# Patient Record
Sex: Male | Born: 1980
Health system: Southern US, Community
[De-identification: ages and names within clinical notes are randomized; demographics above are authoritative.]

## PROBLEM LIST (undated history)

## (undated) DIAGNOSIS — M5126 Other intervertebral disc displacement, lumbar region: Secondary | ICD-10-CM

## (undated) DIAGNOSIS — K219 Gastro-esophageal reflux disease without esophagitis: Secondary | ICD-10-CM

## (undated) DIAGNOSIS — G4730 Sleep apnea, unspecified: Secondary | ICD-10-CM

## (undated) HISTORY — DX: Sleep apnea, unspecified: G47.30

## (undated) HISTORY — DX: Other intervertebral disc displacement, lumbar region: M51.26

## (undated) HISTORY — PX: EYE SURGERY: SHX253

---

## 2004-04-17 ENCOUNTER — Emergency Department: Payer: Self-pay | Admitting: Emergency Medicine

## 2006-01-14 ENCOUNTER — Ambulatory Visit: Payer: Self-pay | Admitting: Internal Medicine

## 2006-01-18 ENCOUNTER — Ambulatory Visit: Payer: Self-pay | Admitting: Internal Medicine

## 2007-01-25 ENCOUNTER — Ambulatory Visit: Payer: Self-pay | Admitting: Internal Medicine

## 2008-06-14 ENCOUNTER — Ambulatory Visit: Payer: Self-pay | Admitting: Internal Medicine

## 2011-02-09 ENCOUNTER — Emergency Department: Payer: Self-pay | Admitting: *Deleted

## 2011-06-25 ENCOUNTER — Ambulatory Visit: Payer: Self-pay

## 2012-06-19 ENCOUNTER — Emergency Department: Payer: Self-pay | Admitting: Emergency Medicine

## 2012-06-19 LAB — RAPID INFLUENZA A&B ANTIGENS

## 2012-06-20 LAB — CBC
HCT: 45.8 % (ref 40.0–52.0)
HGB: 15.5 g/dL (ref 13.0–18.0)
MCH: 29.8 pg (ref 26.0–34.0)
MCHC: 33.8 g/dL (ref 32.0–36.0)
MCV: 88 fL (ref 80–100)
Platelet: 171 10*3/uL (ref 150–440)
RBC: 5.19 10*6/uL (ref 4.40–5.90)
RDW: 13.5 % (ref 11.5–14.5)
WBC: 6.8 10*3/uL (ref 3.8–10.6)

## 2012-06-20 LAB — COMPREHENSIVE METABOLIC PANEL
Albumin: 4.1 g/dL (ref 3.4–5.0)
Alkaline Phosphatase: 93 U/L (ref 50–136)
Anion Gap: 9 (ref 7–16)
BUN: 15 mg/dL (ref 7–18)
Bilirubin,Total: 0.5 mg/dL (ref 0.2–1.0)
Calcium, Total: 8.5 mg/dL (ref 8.5–10.1)
Chloride: 104 mmol/L (ref 98–107)
Co2: 25 mmol/L (ref 21–32)
Creatinine: 1.03 mg/dL (ref 0.60–1.30)
EGFR (African American): >60
EGFR (Non-African Amer.): >60
Glucose: 114 mg/dL — ABNORMAL HIGH (ref 65–99)
Osmolality: 277 (ref 275–301)
Potassium: 3.3 mmol/L — ABNORMAL LOW (ref 3.5–5.1)
SGOT(AST): 45 U/L — ABNORMAL HIGH (ref 15–37)
SGPT (ALT): 55 U/L (ref 12–78)
Sodium: 138 mmol/L (ref 136–145)
Total Protein: 7.7 g/dL (ref 6.4–8.2)

## 2012-10-25 ENCOUNTER — Encounter (HOSPITAL_COMMUNITY): Payer: Self-pay | Admitting: Emergency Medicine

## 2012-10-25 ENCOUNTER — Emergency Department (HOSPITAL_COMMUNITY): Payer: BC Managed Care – PPO

## 2012-10-25 ENCOUNTER — Emergency Department (HOSPITAL_COMMUNITY)
Admission: EM | Admit: 2012-10-25 | Discharge: 2012-10-25 | Disposition: A | Discharge status: Home / Self Care | Payer: BC Managed Care – PPO | Attending: Emergency Medicine | Admitting: Emergency Medicine

## 2012-10-25 DIAGNOSIS — R52 Pain, unspecified: Secondary | ICD-10-CM

## 2012-10-25 DIAGNOSIS — Z8719 Personal history of other diseases of the digestive system: Secondary | ICD-10-CM | POA: Insufficient documentation

## 2012-10-25 DIAGNOSIS — F17200 Nicotine dependence, unspecified, uncomplicated: Secondary | ICD-10-CM | POA: Insufficient documentation

## 2012-10-25 DIAGNOSIS — R0789 Other chest pain: Secondary | ICD-10-CM | POA: Insufficient documentation

## 2012-10-25 DIAGNOSIS — R7309 Other abnormal glucose: Secondary | ICD-10-CM | POA: Insufficient documentation

## 2012-10-25 HISTORY — DX: Gastro-esophageal reflux disease without esophagitis: K21.9

## 2012-10-25 LAB — POCT I-STAT, CHEM 8
BUN: 13 mg/dL (ref 6–23)
Calcium, Ion: 1.2 mmol/L (ref 1.12–1.23)
Chloride: 104 mEq/L (ref 96–112)
Creatinine, Ser: 0.9 mg/dL (ref 0.50–1.35)
Glucose, Bld: 81 mg/dL (ref 70–99)
HCT: 46 % (ref 39.0–52.0)
Hemoglobin: 15.6 g/dL (ref 13.0–17.0)
Potassium: 4 mEq/L (ref 3.5–5.1)
Sodium: 141 mEq/L (ref 135–145)
TCO2: 28 mmol/L (ref 0–100)

## 2012-10-25 LAB — COMPREHENSIVE METABOLIC PANEL
ALT: 43 U/L (ref 0–53)
AST: 40 U/L — ABNORMAL HIGH (ref 0–37)
Albumin: 4.2 g/dL (ref 3.5–5.2)
Alkaline Phosphatase: 91 U/L (ref 39–117)
BUN: 14 mg/dL (ref 6–23)
CO2: 24 mEq/L (ref 19–32)
Calcium: 9.7 mg/dL (ref 8.4–10.5)
Chloride: 101 mEq/L (ref 96–112)
Creatinine, Ser: 0.82 mg/dL (ref 0.50–1.35)
GFR calc Af Amer: >90 mL/min (ref >90)
GFR calc non Af Amer: >90 mL/min (ref >90)
Glucose, Bld: 136 mg/dL — ABNORMAL HIGH (ref 70–99)
Potassium: 3.7 mEq/L (ref 3.5–5.1)
Sodium: 136 mEq/L (ref 135–145)
Total Bilirubin: 0.3 mg/dL (ref 0.3–1.2)
Total Protein: 7.8 g/dL (ref 6.0–8.3)

## 2012-10-25 LAB — CBC
HCT: 44.6 % (ref 39.0–52.0)
Hemoglobin: 15.7 g/dL (ref 13.0–17.0)
MCH: 30.4 pg (ref 26.0–34.0)
MCHC: 35.2 g/dL (ref 30.0–36.0)
MCV: 86.3 fL (ref 78.0–100.0)
Platelets: 200 10*3/uL (ref 150–400)
RBC: 5.17 MIL/uL (ref 4.22–5.81)
RDW: 12.8 % (ref 11.5–15.5)
WBC: 7.2 10*3/uL (ref 4.0–10.5)

## 2012-10-25 LAB — POCT I-STAT TROPONIN I: Troponin i, poc: 0 ng/mL (ref 0.00–0.08)

## 2012-10-25 LAB — D-DIMER, QUANTITATIVE: D-Dimer, Quant: <0.27 ug/mL-FEU (ref 0.00–0.48)

## 2012-10-25 MED ORDER — HYDROCODONE-ACETAMINOPHEN 5-325 MG PO TABS: 2.0000 | ORAL_TABLET | Freq: Four times a day (QID) | ORAL | Status: DC | PRN

## 2012-10-25 MED ORDER — HYDROCODONE-ACETAMINOPHEN 5-325 MG PO TABS
2.0000 | ORAL_TABLET | Freq: Once | ORAL | Status: AC
  Administered 2012-10-25: 2 via ORAL
  Filled 2012-10-25: qty 2

## 2012-10-25 NOTE — ED Notes (Signed)
crampy pain in chest mostly left side hurts to take a deep breath no n/v started 30 mins ago after he ate

## 2012-10-25 NOTE — ED Provider Notes (Signed)
History     CSN: [id number]  Arrival date & time 10/25/12  1255   First MD Initiated Contact with Patient 10/25/12 1329      Chief Complaint  Patient presents with  . Chest Pain    (Consider location/radiation/quality/duration/timing/severity/associated sxs/prior treatment) Patient is a 32 y.o. male presenting with chest pain.  Chest Pain  comPlains of chest pain sudden onset left-sided pleuritic 11:45 AM today while he was doing Calking. Pain is pleuritic worse with changing positions not improved by anything moderate at present. No cough no fever no other associated symptoms. Past Medical History  Diagnosis Date  . Acid reflux    Cardiac risk factors smoker, family history No past surgical history on file.  No family history on file. family history father had MI age 66 History  Substance Use Topics  . Smoking status: Light Tobacco Smoker  . Smokeless tobacco: Not on file  . Alcohol Use: Yes      Review of Systems  Constitutional: Negative.   HENT: Negative.   Respiratory: Negative.   Cardiovascular: Positive for chest pain.  Gastrointestinal: Negative.   Musculoskeletal: Negative.   Skin: Negative.   Neurological: Negative.   Psychiatric/Behavioral: Negative.   All other systems reviewed and are negative.    Allergies  Review of patient's allergies indicates no known allergies.  Home Medications  No current outpatient prescriptions on file.  BP 136/78  Pulse 76  Temp(Src) 97.4 F (36.3 C) (Oral)  Resp 24  SpO2 100%  Physical Exam  Nursing note and vitals reviewed. Constitutional: He appears well-developed and well-nourished.  HENT:  Head: Normocephalic and atraumatic.  Eyes: Conjunctivae are normal. Pupils are equal, round, and reactive to light.  Neck: Neck supple. No tracheal deviation present. No thyromegaly present.  Cardiovascular: Normal rate and regular rhythm.   No murmur heard. Pulmonary/Chest: Effort normal and breath sounds normal.  He exhibits tenderness.  Left chest wall is tender. Pain is reproducible by forcible abduction of left shoulder  Abdominal: Soft. Bowel sounds are normal. He exhibits no distension. There is no tenderness.  Musculoskeletal: Normal range of motion. He exhibits no edema and no tenderness.  Neurological: He is alert. Coordination normal.  Skin: Skin is warm and dry. No rash noted.  Psychiatric: He has a normal mood and affect.    ED Course  Procedures (including critical care time)  Labs Reviewed  COMPREHENSIVE METABOLIC PANEL - Abnormal; Notable for the following:    Glucose, Bld 136 (*)    AST 40 (*)    All other components within normal limits  CBC  D-DIMER, QUANTITATIVE  POCT I-STAT TROPONIN I   Dg Chest 2 View  10/25/2012  *RADIOLOGY REPORT*  Clinical Data: Chest pain  CHEST - 2 VIEW  Comparison: None.  Findings: Cardiomediastinal silhouette is unremarkable.  Mild elevation of the right hemidiaphragm.  No acute infiltrate or pleural effusion.  No pulmonary edema.  Bony thorax is unremarkable.  IMPRESSION: No active disease.  Mild elevation of the right hemidiaphragm.   Original Report Authenticated By: Natasha Mead, M.D.    chest xray viewed byme  Date: 10/25/2012  Rate: 70  Rhythm: normal sinus rhythm  QRS Axis: normal  Intervals: normal  ST/T Wave abnormalities: normal  Conduction Disutrbances:nonspecific intraventricular conduction delay  Narrative Interpretation:   Old EKG Reviewed: none available Results for orders placed during the hospital encounter of 10/25/12  CBC      Result Value Range   WBC 7.2  4.0 - 10.5 K/uL  RBC 5.17  4.22 - 5.81 MIL/uL   Hemoglobin 15.7  13.0 - 17.0 g/dL   HCT 04.5  40.9 - 81.1 %   MCV 86.3  78.0 - 100.0 fL   MCH 30.4  26.0 - 34.0 pg   MCHC 35.2  30.0 - 36.0 g/dL   RDW 91.4  78.2 - 95.6 %   Platelets 200  150 - 400 K/uL  COMPREHENSIVE METABOLIC PANEL      Result Value Range   Sodium 136  135 - 145 mEq/L   Potassium 3.7  3.5 - 5.1 mEq/L    Chloride 101  96 - 112 mEq/L   CO2 24  19 - 32 mEq/L   Glucose, Bld 136 (*) 70 - 99 mg/dL   BUN 14  6 - 23 mg/dL   Creatinine, Ser 2.13  0.50 - 1.35 mg/dL   Calcium 9.7  8.4 - 08.6 mg/dL   Total Protein 7.8  6.0 - 8.3 g/dL   Albumin 4.2  3.5 - 5.2 g/dL   AST 40 (*) 0 - 37 U/L   ALT 43  0 - 53 U/L   Alkaline Phosphatase 91  39 - 117 U/L   Total Bilirubin 0.3  0.3 - 1.2 mg/dL   GFR calc non Af Amer >90  >90 mL/min   GFR calc Af Amer >90  >90 mL/min  D-DIMER, QUANTITATIVE      Result Value Range   D-Dimer, Quant <0.27  0.00 - 0.48 ug/mL-FEU  POCT I-STAT TROPONIN I      Result Value Range   Troponin i, poc 0.00  0.00 - 0.08 ng/mL   Comment 3           POCT I-STAT, CHEM 8      Result Value Range   Sodium 141  135 - 145 mEq/L   Potassium 4.0  3.5 - 5.1 mEq/L   Chloride 104  96 - 112 mEq/L   BUN 13  6 - 23 mg/dL   Creatinine, Ser 5.78  0.50 - 1.35 mg/dL   Glucose, Bld 81  70 - 99 mg/dL   Calcium, Ion 4.69  1.12 - 1.23 mmol/L   TCO2 28  0 - 100 mmol/L   Hemoglobin 15.6  13.0 - 17.0 g/dL   HCT 62.9  52.8 - 41.3 %   Dg Chest 2 View  10/25/2012  *RADIOLOGY REPORT*  Clinical Data: Chest pain  CHEST - 2 VIEW  Comparison: None.  Findings: Cardiomediastinal silhouette is unremarkable.  Mild elevation of the right hemidiaphragm.  No acute infiltrate or pleural effusion.  No pulmonary edema.  Bony thorax is unremarkable.  IMPRESSION: No active disease.  Mild elevation of the right hemidiaphragm.   Original Report Authenticated By: Natasha Mead, M.D.       No diagnosis found.  4:30 PM pain improved after treatment with Norco. MDM  Symptoms and exam consistent with musculoskeletal chest pain,. Strong doubt acute coronary syndrome in this young male. Doubt pe , low pre-test prob neg ddimer.  Counccilled for 5 minutes on smoking cessation Plan prescription Norco Patient is instructed to followup with his primary care physician regarding hyperglycemia and smoking cessation Diagnosis #1  atypical chest pain #2 hyperglycemia #3 tobacco abuse        Doug Sou, MD 10/25/12 1639

## 2013-01-09 ENCOUNTER — Ambulatory Visit: Payer: Self-pay

## 2014-05-12 ENCOUNTER — Emergency Department: Payer: Self-pay | Admitting: Student

## 2014-05-15 ENCOUNTER — Ambulatory Visit: Payer: Self-pay

## 2014-05-16 ENCOUNTER — Emergency Department: Payer: Self-pay | Admitting: Emergency Medicine

## 2014-05-16 LAB — D-DIMER(ARMC): D-Dimer: 442 ng/ml

## 2014-05-16 LAB — CBC WITH DIFFERENTIAL/PLATELET
Basophil #: 0.1 10*3/uL (ref 0.0–0.1)
Basophil %: 0.6 %
Eosinophil #: 0.1 10*3/uL (ref 0.0–0.7)
Eosinophil %: 1.5 %
HCT: 48.3 % (ref 40.0–52.0)
HGB: 16 g/dL (ref 13.0–18.0)
Lymphocyte #: 3.1 10*3/uL (ref 1.0–3.6)
Lymphocyte %: 37.6 %
MCH: 29.9 pg (ref 26.0–34.0)
MCHC: 33.3 g/dL (ref 32.0–36.0)
MCV: 90 fL (ref 80–100)
Monocyte #: 0.6 x10 3/mm (ref 0.2–1.0)
Monocyte %: 7.2 %
Neutrophil #: 4.4 10*3/uL (ref 1.4–6.5)
Neutrophil %: 53.1 %
Platelet: 228 10*3/uL (ref 150–440)
RBC: 5.37 10*6/uL (ref 4.40–5.90)
RDW: 12.9 % (ref 11.5–14.5)
WBC: 8.2 10*3/uL (ref 3.8–10.6)

## 2014-05-16 LAB — COMPREHENSIVE METABOLIC PANEL
Albumin: 3.9 g/dL (ref 3.4–5.0)
Alkaline Phosphatase: 80 U/L
Anion Gap: 5 — ABNORMAL LOW (ref 7–16)
BUN: 16 mg/dL (ref 7–18)
Bilirubin,Total: 0.3 mg/dL (ref 0.2–1.0)
Calcium, Total: 8.8 mg/dL (ref 8.5–10.1)
Chloride: 106 mmol/L (ref 98–107)
Co2: 29 mmol/L (ref 21–32)
Creatinine: 0.9 mg/dL (ref 0.60–1.30)
EGFR (African American): >60
EGFR (Non-African Amer.): >60
Glucose: 84 mg/dL (ref 65–99)
Osmolality: 280 (ref 275–301)
Potassium: 3.8 mmol/L (ref 3.5–5.1)
SGOT(AST): 22 U/L (ref 15–37)
SGPT (ALT): 34 U/L
Sodium: 140 mmol/L (ref 136–145)
Total Protein: 7.3 g/dL (ref 6.4–8.2)

## 2014-05-31 ENCOUNTER — Ambulatory Visit: Payer: Self-pay | Admitting: Internal Medicine

## 2014-08-05 ENCOUNTER — Emergency Department: Payer: Self-pay | Admitting: Internal Medicine

## 2014-08-05 LAB — COMPREHENSIVE METABOLIC PANEL
Albumin: 4.2 g/dL (ref 3.4–5.0)
Alkaline Phosphatase: 88 U/L (ref 46–116)
Anion Gap: 7 (ref 7–16)
BUN: 13 mg/dL (ref 7–18)
Bilirubin,Total: 0.3 mg/dL (ref 0.2–1.0)
Calcium, Total: 8.8 mg/dL (ref 8.5–10.1)
Chloride: 110 mmol/L — ABNORMAL HIGH (ref 98–107)
Co2: 25 mmol/L (ref 21–32)
Creatinine: 0.93 mg/dL (ref 0.60–1.30)
EGFR (African American): >60
EGFR (Non-African Amer.): >60
Glucose: 109 mg/dL — ABNORMAL HIGH (ref 65–99)
Osmolality: 284 (ref 275–301)
Potassium: 3.6 mmol/L (ref 3.5–5.1)
SGOT(AST): 38 U/L — ABNORMAL HIGH (ref 15–37)
SGPT (ALT): 53 U/L (ref 14–63)
Sodium: 142 mmol/L (ref 136–145)
Total Protein: 7.9 g/dL (ref 6.4–8.2)

## 2014-08-05 LAB — CBC
HCT: 47.2 % (ref 40.0–52.0)
HGB: 15.3 g/dL (ref 13.0–18.0)
MCH: 29.4 pg (ref 26.0–34.0)
MCHC: 32.5 g/dL (ref 32.0–36.0)
MCV: 91 fL (ref 80–100)
Platelet: 227 10*3/uL (ref 150–440)
RBC: 5.21 10*6/uL (ref 4.40–5.90)
RDW: 13.8 % (ref 11.5–14.5)
WBC: 9.8 10*3/uL (ref 3.8–10.6)

## 2014-08-05 LAB — DRUG SCREEN, URINE
Amphetamines, Ur Screen: NEGATIVE (ref ?–1000)
Barbiturates, Ur Screen: NEGATIVE (ref ?–200)
Benzodiazepine, Ur Scrn: NEGATIVE (ref ?–200)
Cannabinoid 50 Ng, Ur ~~LOC~~: POSITIVE (ref ?–50)
Cocaine Metabolite,Ur ~~LOC~~: NEGATIVE (ref ?–300)
MDMA (Ecstasy)Ur Screen: NEGATIVE (ref ?–500)
Methadone, Ur Screen: NEGATIVE (ref ?–300)
Opiate, Ur Screen: NEGATIVE (ref ?–300)
Phencyclidine (PCP) Ur S: NEGATIVE (ref ?–25)
Tricyclic, Ur Screen: NEGATIVE (ref ?–1000)

## 2014-08-05 LAB — URINALYSIS, COMPLETE
Bacteria: NONE SEEN
Bilirubin,UR: NEGATIVE
Blood: NEGATIVE
Glucose,UR: NEGATIVE mg/dL (ref 0–75)
Ketone: NEGATIVE
Leukocyte Esterase: NEGATIVE
Nitrite: NEGATIVE
Ph: 5 (ref 4.5–8.0)
Protein: NEGATIVE
RBC,UR: NONE SEEN /HPF (ref 0–5)
Specific Gravity: 1.025 (ref 1.003–1.030)
Squamous Epithelial: NONE SEEN
WBC UR: NONE SEEN /HPF (ref 0–5)

## 2014-08-05 LAB — ETHANOL: Ethanol: <3 mg/dL

## 2014-08-05 LAB — SALICYLATE LEVEL: Salicylates, Serum: <1.7 mg/dL

## 2014-08-05 LAB — ACETAMINOPHEN LEVEL: Acetaminophen: <2 ug/mL

## 2014-10-28 NOTE — Consult Note (Signed)
PATIENT NAME:  John Randall, John Randall MR#:  [id number] DATE OF BIRTH:  [birth year]  DATE OF CONSULTATION:  08/06/2014  CONSULTING PHYSICIAN:  Nolberto Cheuvront S. Garnetta Buddy, MD  REASON FOR CONSULTATION: "I had a fight with my wife and I told her that I was going to throw myself in the river."   HISTORY OF PRESENT ILLNESS:  The patient is a 34 year old married male presented to the ER on IVC, as he was engaged in an argument with his wife. The patient reported that he currently lives with his wife and they have five children together. The patient's wife has three children and he currently has two daughters, 64 and 50 years old. The patient reported that his wife usually has arguments with him and he cannot tolerate the arguments any longer. The patient reported that he does not have any thoughts to hurt himself.  He was worried about how this would affect his daughters, ages 37 and 77 years old. He reported that his wife keeps on yelling and arguing with him all the time.  He reported that she would stop about one thing and then started yelling  The patient reported that when she was arguing yesterday, he just tested her that he is going to throw himself in the river. Then he became depressed and left the house. He went to the police department, so they can talk over there in front of the police. The wife came over there and they were talking and then the police decided to bring him to the hospital. The patient reported that he is interested in doing some family counseling so they can decrease the stress and manage raising their five children together. The patient reported that his daughters have a good relationship with and his wife and sons and they all live together.  He reported that he does not have any issues with his wife, but she has bad temperament. He reported that he is unable to handle the daily stress of the life.  He denied any previous history of psychiatric illness and does not take any medications.   PAST PSYCHIATRIC  HISTORY: The patient denied any history of suicide attempts. He denied any history of inpatient psychiatric hospitalization. He reported that he has fair a relationship with his wife. He reported that his first wife passed away when his daughter was 4-year-old.   PAST MEDICAL HISTORY: The patient reported history of chronic back pain and takes Percocet for the same.   ALLERGIES: No known drug allergies.   LABORATORY DATA:  Urine drug screen positive for cannabinoids. Other laboratory include glucose 109, BUN 13, creatinine 0.93, sodium 142, potassium 3.6, chloride 110, bicarbonate 25, anion gap 7, osmolality 284, calcium 8.8. Blood alcohol level less than 3, protein 7.9, albumin 4.2, bilirubin 0.3, alkaline phosphatase 88, AST  38, ALT 53. UDS positive for cannabinoids. WBC 9.8, hemoglobin 15.3, hematocrit 47.2, MCV 29.4, RDW 13.8.   VITAL SIGNS: Temperature 97.5, pulse 71, respirations 18, blood pressure 109/53.   SOCIAL HISTORY: The patient is currently married for the past three years and they live with their five children. He works in a [employer]. He does not have any pending legal charges.   REVIEW OF SYSTEMS:  CONSTITUTIONAL: Denies any fever or chills. No weight changes.  EYES: No double or blurred vision.  RESPIRATORY: No shortness of breath or cough.  CARDIOVASCULAR: No chest pain or orthopnea.  GASTROINTESTINAL: No abdominal pain, nausea, vomiting or diarrhea.  GENITOURINARY: No incontinence or frequency.  ENDOCRINE: No  heat or cold intolerance.  LYMPHATICS: No anemia or easy bruising.  INTEGUMENTARY: No acne or rash.  MUSCULOSKELETAL: Having back pain.   MENTAL STATUS EXAMINATION:  The patient is a moderately built male who appeared his stated age. He appears well nourished. Muscle tone appears normal with no  abnormal movements noted. Gait and station was normal.  His speech was low in tone and volume. Mood was somewhat anxious. Affect was congruent. Thought process was  logical, goal-directed. Thought content was non-delusional. He currently denies having any suicidal or homicidal ideations or plans. His insight and judgment were fair. He was awake, alert and oriented x3. Recent and remote memory were intact. Attention span and concentration were normal. He denies having any thoughts to harm himself.   DIAGNOSTIC IMPRESSION:  AXIS I: Adjustment disorder with depressed mood.  AXIS II: None.  AXIS III: Chronic back pain.   TREATMENT PLAN:  I will release the patient from the involuntary commitment and he can be discharged back home and will [facility]. No medications will be given to the patient at this time. I discussed with the patient about the same, and he demonstrated understanding.   Thank you for allowing me to participate in the care of this patient.     ____________________________ Ardeen FillersUzma S. Garnetta BuddyFaheem, MD usf:at D: 08/06/2014 13:15:06 ET T: 08/06/2014 13:45:36 ET JOB#: [id number]  cc: Ardeen FillersUzma S. Garnetta BuddyFaheem, MD, <Dictator> Rhunette CroftUZMA S Mehul Rudin MD ELECTRONICALLY SIGNED 08/10/2014 10:39

## 2015-06-27 ENCOUNTER — Encounter: Payer: Self-pay | Admitting: Pain Medicine

## 2015-06-27 ENCOUNTER — Other Ambulatory Visit: Payer: Self-pay | Admitting: Pain Medicine

## 2015-06-27 DIAGNOSIS — M51369 Other intervertebral disc degeneration, lumbar region without mention of lumbar back pain or lower extremity pain: Secondary | ICD-10-CM

## 2015-06-27 DIAGNOSIS — M5410 Radiculopathy, site unspecified: Secondary | ICD-10-CM

## 2015-06-27 DIAGNOSIS — F329 Major depressive disorder, single episode, unspecified: Secondary | ICD-10-CM | POA: Insufficient documentation

## 2015-06-27 DIAGNOSIS — M79606 Pain in leg, unspecified: Secondary | ICD-10-CM

## 2015-06-27 DIAGNOSIS — F4321 Adjustment disorder with depressed mood: Secondary | ICD-10-CM | POA: Insufficient documentation

## 2015-06-27 DIAGNOSIS — M5136 Other intervertebral disc degeneration, lumbar region: Secondary | ICD-10-CM

## 2015-06-27 DIAGNOSIS — Z5181 Encounter for therapeutic drug level monitoring: Secondary | ICD-10-CM | POA: Insufficient documentation

## 2015-06-27 DIAGNOSIS — R52 Pain, unspecified: Secondary | ICD-10-CM | POA: Insufficient documentation

## 2015-06-27 DIAGNOSIS — F32A Depression, unspecified: Secondary | ICD-10-CM | POA: Insufficient documentation

## 2015-06-27 DIAGNOSIS — M545 Low back pain, unspecified: Secondary | ICD-10-CM | POA: Insufficient documentation

## 2015-06-27 DIAGNOSIS — F1190 Opioid use, unspecified, uncomplicated: Secondary | ICD-10-CM | POA: Insufficient documentation

## 2015-06-27 DIAGNOSIS — Z79891 Long term (current) use of opiate analgesic: Secondary | ICD-10-CM | POA: Insufficient documentation

## 2015-06-27 DIAGNOSIS — M5126 Other intervertebral disc displacement, lumbar region: Secondary | ICD-10-CM

## 2015-06-27 DIAGNOSIS — G8929 Other chronic pain: Secondary | ICD-10-CM | POA: Insufficient documentation

## 2015-06-27 HISTORY — DX: Other intervertebral disc displacement, lumbar region: M51.26

## 2015-06-27 HISTORY — DX: Other intervertebral disc degeneration, lumbar region without mention of lumbar back pain or lower extremity pain: M51.369

## 2015-06-27 HISTORY — DX: Other intervertebral disc degeneration, lumbar region: M51.36

## 2015-06-27 NOTE — Progress Notes (Signed)
Patient ID: John Randall, male   DOB: 26-Oct-1980, 34 y.o.   MRN: [id number]  The patient called the hospital today on 06/27/2015 at about 6:30 PM saying that he had been trying to communicate all day long with the pain clinic. He was somewhat agitated because according to him he had been placed on hold for a long time. However, when I asked him about the telephone number that he had called, he made an excuse and indicated that he did not have. I did provide him with the telephone number to the clinic [phone] and instructed him to call that number if he ever needed [place]. He indicated that he wanted to try to come in tomorrow to be seen. However, we do not have anything available until the new year. This patient has never been seen at [facility] and the last time I saw him was at the [facility] office.    According to the [facility] PMP the first prescription that I ever gave him was on 01/23/2015 for oxycodone 5 mg to be taken 1 tablet by mouth 4 times a day when necessary for pain. The last time that I saw this patient appears to have pain around 02/20/2015 at which time he was provided with 3 prescriptions. The first one was filled on 02/23/2015, the second one on 03/26/2015, and the third one on 04/26/2015. If he was using his medications regularly, this should've lasted him until 05/27/2015. It is now 06/27/2015 and he now calls asking to come in.   To get a better idea what was going on, I asked the patient where his pain was and he indicated that his worst pain is in the lower extremities with the left being worst on the right. He indicates that the pain in the left leg goes all the way down to the calf region through the back of the leg in what seems to be more of a "referred" pain pattern distribution. However, he indicates that this pain occasionally will go to the top of the foot in what seems to be an L5 dermatomal distribution. In terms of the right leg, he indicates that the pain also goes down to  the calf region and it never goes into the foot. He secondary pain is described to be the lower back with the pain being in the center of the lower back and some pain on both sides with the left being worse than the right.   In trying to remember a little bit more about his case, I asked him what he had done in the past and he indicated that he had been to [facility] were he had a series of lumbar epidural steroid injections that according to him did not help. However, in looking at "care everywhere" in Epic, it turns out that he had a bilateral L5-S1 transforaminal epidural steroid injection done at [facility] and apparently he never went back. There is no follow-up to this procedure so there is no evidence that it did or did not help. According to the patient we also did a facet block at [facility] that did not help. Once again, I do not have those notes to confirm.   In any case, I have explained to the patient that I cannot call in a controlled substances for him but that I would be more than glad to call in a steroid pack to see if we can lower some of this pain. He was quick to say that  it does not help him, even though I do not have any evidence that he has had this prescribed to him before. [facility] called [facility] [address] [place] [phone]. I called a Medrol Dosepak for him.   I made an attempt to get a lumbar MRI, which according to him [facility]. The only thing that I found was a reference to it from a [facility]surgical consult [facility] where it indicated that he had an L4-5 disc bulge. The patient indicates that [facility] offered him surgery, but because the recording. From the surgery was so long, he decided against it, since he has to work for a living.   I am a little bit concerned about this patient's history since it does not make any sense that if he wants to get better he is avoiding things that could help him, and instead he is  concentrating on the pain medicine. It is true that it would seem that he is not using much, but then again he mentioned that [place] of [place].

## 2015-07-04 ENCOUNTER — Other Ambulatory Visit: Payer: Self-pay | Admitting: Pain Medicine

## 2015-07-04 ENCOUNTER — Ambulatory Visit: Payer: BLUE CROSS/BLUE SHIELD | Attending: Pain Medicine | Admitting: Pain Medicine

## 2015-07-04 ENCOUNTER — Encounter: Payer: Self-pay | Admitting: Pain Medicine

## 2015-07-04 VITALS — BP 129/81 | HR 86 | Temp 97.9°F | Resp 18 | Ht 72.0 in | Wt 220.0 lb

## 2015-07-04 DIAGNOSIS — R937 Abnormal findings on diagnostic imaging of other parts of musculoskeletal system: Secondary | ICD-10-CM | POA: Insufficient documentation

## 2015-07-04 DIAGNOSIS — F1190 Opioid use, unspecified, uncomplicated: Secondary | ICD-10-CM

## 2015-07-04 DIAGNOSIS — Z5181 Encounter for therapeutic drug level monitoring: Secondary | ICD-10-CM

## 2015-07-04 DIAGNOSIS — M5410 Radiculopathy, site unspecified: Secondary | ICD-10-CM | POA: Diagnosis not present

## 2015-07-04 DIAGNOSIS — M545 Low back pain, unspecified: Secondary | ICD-10-CM

## 2015-07-04 DIAGNOSIS — M79605 Pain in left leg: Secondary | ICD-10-CM | POA: Diagnosis not present

## 2015-07-04 DIAGNOSIS — M5416 Radiculopathy, lumbar region: Secondary | ICD-10-CM

## 2015-07-04 DIAGNOSIS — M549 Dorsalgia, unspecified: Secondary | ICD-10-CM | POA: Insufficient documentation

## 2015-07-04 DIAGNOSIS — M79602 Pain in left arm: Secondary | ICD-10-CM

## 2015-07-04 DIAGNOSIS — M5136 Other intervertebral disc degeneration, lumbar region: Secondary | ICD-10-CM

## 2015-07-04 DIAGNOSIS — Z7189 Other specified counseling: Secondary | ICD-10-CM

## 2015-07-04 DIAGNOSIS — Z5189 Encounter for other specified aftercare: Secondary | ICD-10-CM

## 2015-07-04 DIAGNOSIS — M5126 Other intervertebral disc displacement, lumbar region: Secondary | ICD-10-CM | POA: Insufficient documentation

## 2015-07-04 DIAGNOSIS — K219 Gastro-esophageal reflux disease without esophagitis: Secondary | ICD-10-CM | POA: Diagnosis not present

## 2015-07-04 DIAGNOSIS — Z79891 Long term (current) use of opiate analgesic: Secondary | ICD-10-CM

## 2015-07-04 DIAGNOSIS — M51369 Other intervertebral disc degeneration, lumbar region without mention of lumbar back pain or lower extremity pain: Secondary | ICD-10-CM

## 2015-07-04 DIAGNOSIS — Z87891 Personal history of nicotine dependence: Secondary | ICD-10-CM | POA: Diagnosis not present

## 2015-07-04 DIAGNOSIS — G8929 Other chronic pain: Secondary | ICD-10-CM

## 2015-07-04 DIAGNOSIS — R52 Pain, unspecified: Secondary | ICD-10-CM

## 2015-07-04 MED ORDER — OXYCODONE-ACETAMINOPHEN 5-325 MG PO TABS: 1.0000 | ORAL_TABLET | Freq: Four times a day (QID) | ORAL | Status: DC | PRN

## 2015-07-04 NOTE — Progress Notes (Signed)
Safety precautions to be maintained throughout the outpatient stay will include: orient to surroundings, keep bed in low position, maintain call bell within reach at all times, provide assistance with transfer out of bed and ambulation. Did not bring meds to office because he is out.

## 2015-07-04 NOTE — Patient Instructions (Signed)
GENERAL RISKS AND COMPLICATIONS  What are the risk, side effects and possible complications? Generally speaking, most procedures are safe.  However, with any procedure there are risks, side effects, and the possibility of complications.  The risks and complications are dependent upon the sites that are lesioned, or the type of nerve block to be performed.  The closer the procedure is to the spine, the more serious the risks are.  Great care is taken when placing the radio frequency needles, block needles or lesioning probes, but sometimes complications can occur. 1. Infection: Any time there is an injection through the skin, there is a risk of infection.  This is why sterile conditions are used for these blocks.  There are four possible types of infection. 1. Localized skin infection. 2. Central Nervous System Infection-This can be in the form of Meningitis, which can be deadly. 3. Epidural Infections-This can be in the form of an epidural abscess, which can cause pressure inside of the spine, causing compression of the spinal cord with subsequent paralysis. This would require an emergency surgery to decompress, and there are no guarantees that the patient would recover from the paralysis. 4. Discitis-This is an infection of the intervertebral discs.  It occurs in about 1% of discography procedures.  It is difficult to treat and it may lead to surgery.        2. Pain: the needles have to go through skin and soft tissues, will cause soreness.       3. Damage to internal structures:  The nerves to be lesioned may be near blood vessels or    other nerves which can be potentially damaged.       4. Bleeding: Bleeding is more common if the patient is taking blood thinners such as  aspirin, Coumadin, Ticiid, Plavix, etc., or if he/she have some genetic predisposition  such as hemophilia. Bleeding into the spinal canal can cause compression of the spinal  cord with subsequent paralysis.  This would require an  emergency surgery to  decompress and there are no guarantees that the patient would recover from the  paralysis.       5. Pneumothorax:  Puncturing of a lung is a possibility, every time a needle is introduced in  the area of the chest or upper back.  Pneumothorax refers to free air around the  collapsed lung(s), inside of the thoracic cavity (chest cavity).  Another two possible  complications related to a similar event would include: Hemothorax and Chylothorax.   These are variations of the Pneumothorax, where instead of air around the collapsed  lung(s), you may have blood or chyle, respectively.       6. Spinal headaches: They may occur with any procedures in the area of the spine.       7. Persistent CSF (Cerebro-Spinal Fluid) leakage: This is a rare problem, but may occur  with prolonged intrathecal or epidural catheters either due to the formation of a fistulous  track or a dural tear.       8. Nerve damage: By working so close to the spinal cord, there is always a possibility of  nerve damage, which could be as serious as a permanent spinal cord injury with  paralysis.       9. Death:  Although rare, severe deadly allergic reactions known as "Anaphylactic  reaction" can occur to any of the medications used.      10. Worsening of the symptoms:  We can always make thing worse.    What are the chances of something like this happening? Chances of any of this occuring are extremely low.  By statistics, you have more of a chance of getting killed in a motor vehicle accident: while driving to the hospital than any of the above occurring .  Nevertheless, you should be aware that they are possibilities.  In general, it is similar to taking a shower.  Everybody knows that you can slip, hit your head and get killed.  Does that mean that you should not shower again?  Nevertheless always keep in mind that statistics do not mean anything if you happen to be on the wrong side of them.  Even if a procedure has a 1  (one) in a 1,000,000 (million) chance of going wrong, it you happen to be that one..Also, keep in mind that by statistics, you have more of a chance of having something go wrong when taking medications.  Who should not have this procedure? If you are on a blood thinning medication (e.g. Coumadin, Plavix, see list of "Blood Thinners"), or if you have an active infection going on, you should not have the procedure.  If you are taking any blood thinners, please inform your physician.  How should I prepare for this procedure?  Do not eat or drink anything at least six hours prior to the procedure.  Bring a driver with you .  It cannot be a taxi.  Come accompanied by an adult that can drive you back, and that is strong enough to help you if your legs get weak or numb from the local anesthetic.  Take all of your medicines the morning of the procedure with just enough water to swallow them.  If you have diabetes, make sure that you are scheduled to have your procedure done first thing in the morning, whenever possible.  If you have diabetes, take only half of your insulin dose and notify our nurse that you have done so as soon as you arrive at the clinic.  If you are diabetic, but only take blood sugar pills (oral hypoglycemic), then do not take them on the morning of your procedure.  You may take them after you have had the procedure.  Do not take aspirin or any aspirin-containing medications, at least eleven (11) days prior to the procedure.  They may prolong bleeding.  Wear loose fitting clothing that may be easy to take off and that you would not mind if it got stained with Betadine or blood.  Do not wear any jewelry or perfume  Remove any nail coloring.  It will interfere with some of our monitoring equipment.  NOTE: Remember that this is not meant to be interpreted as a complete list of all possible complications.  Unforeseen problems may occur.  BLOOD THINNERS The following drugs  contain aspirin or other products, which can cause increased bleeding during surgery and should not be taken for 2 weeks prior to and 1 week after surgery.  If you should need take something for relief of minor pain, you may take acetaminophen which is found in Tylenol,m Datril, Anacin-3 and Panadol. It is not blood thinner. The products listed below are.  Do not take any of the products listed below in addition to any listed on your instruction sheet.  A.P.C or A.P.C with Codeine Codeine Phosphate Capsules #3 Ibuprofen Ridaura  ABC compound Congesprin Imuran rimadil  Advil Cope Indocin Robaxisal  Alka-Seltzer Effervescent Pain Reliever and Antacid Coricidin or Coricidin-D  Indomethacin Rufen    Alka-Seltzer plus Cold Medicine Cosprin Ketoprofen S-A-C Tablets  Anacin Analgesic Tablets or Capsules Coumadin Korlgesic Salflex  Anacin Extra Strength Analgesic tablets or capsules CP-2 Tablets Lanoril Salicylate  Anaprox Cuprimine Capsules Levenox Salocol  Anexsia-D Dalteparin Magan Salsalate  Anodynos Darvon compound Magnesium Salicylate Sine-off  Ansaid Dasin Capsules Magsal Sodium Salicylate  Anturane Depen Capsules Marnal Soma  APF Arthritis pain formula Dewitt's Pills Measurin Stanback  Argesic Dia-Gesic Meclofenamic Sulfinpyrazone  Arthritis Bayer Timed Release Aspirin Diclofenac Meclomen Sulindac  Arthritis pain formula Anacin Dicumarol Medipren Supac  Analgesic (Safety coated) Arthralgen Diffunasal Mefanamic Suprofen  Arthritis Strength Bufferin Dihydrocodeine Mepro Compound Suprol  Arthropan liquid Dopirydamole Methcarbomol with Aspirin Synalgos  ASA tablets/Enseals Disalcid Micrainin Tagament  Ascriptin Doan's Midol Talwin  Ascriptin A/D Dolene Mobidin Tanderil  Ascriptin Extra Strength Dolobid Moblgesic Ticlid  Ascriptin with Codeine Doloprin or Doloprin with Codeine Momentum Tolectin  Asperbuf Duoprin Mono-gesic Trendar  Aspergum Duradyne Motrin or Motrin IB Triminicin  Aspirin  plain, buffered or enteric coated Durasal Myochrisine Trigesic  Aspirin Suppositories Easprin Nalfon Trillsate  Aspirin with Codeine Ecotrin Regular or Extra Strength Naprosyn Uracel  Atromid-S Efficin Naproxen Ursinus  Auranofin Capsules Elmiron Neocylate Vanquish  Axotal Emagrin Norgesic Verin  Azathioprine Empirin or Empirin with Codeine Normiflo Vitamin E  Azolid Emprazil Nuprin Voltaren  Bayer Aspirin plain, buffered or children's or timed BC Tablets or powders Encaprin Orgaran Warfarin Sodium  Buff-a-Comp Enoxaparin Orudis Zorpin  Buff-a-Comp with Codeine Equegesic Os-Cal-Gesic   Buffaprin Excedrin plain, buffered or Extra Strength Oxalid   Bufferin Arthritis Strength Feldene Oxphenbutazone   Bufferin plain or Extra Strength Feldene Capsules Oxycodone with Aspirin   Bufferin with Codeine Fenoprofen Fenoprofen Pabalate or Pabalate-SF   Buffets II Flogesic Panagesic   Buffinol plain or Extra Strength Florinal or Florinal with Codeine Panwarfarin   Buf-Tabs Flurbiprofen Penicillamine   Butalbital Compound Four-way cold tablets Penicillin   Butazolidin Fragmin Pepto-Bismol   Carbenicillin Geminisyn Percodan   Carna Arthritis Reliever Geopen Persantine   Carprofen Gold's salt Persistin   Chloramphenicol Goody's Phenylbutazone   Chloromycetin Haltrain Piroxlcam   Clmetidine heparin Plaquenil   Cllnoril Hyco-pap Ponstel   Clofibrate Hydroxy chloroquine Propoxyphen         Before stopping any of these medications, be sure to consult the physician who ordered them.  Some, such as Coumadin (Warfarin) are ordered to prevent or treat serious conditions such as "deep thrombosis", "pumonary embolisms", and other heart problems.  The amount of time that you may need off of the medication may also vary with the medication and the reason for which you were taking it.  If you are taking any of these medications, please make sure you notify your pain physician before you undergo any  procedures.         Epidural Steroid Injection Patient Information  Description: The epidural space surrounds the nerves as they exit the spinal cord.  In some patients, the nerves can be compressed and inflamed by a bulging disc or a tight spinal canal (spinal stenosis).  By injecting steroids into the epidural space, we can bring irritated nerves into direct contact with a potentially helpful medication.  These steroids act directly on the irritated nerves and can reduce swelling and inflammation which often leads to decreased pain.  Epidural steroids may be injected anywhere along the spine and from the neck to the low back depending upon the location of your pain.   After numbing the skin with local anesthetic (like Novocaine), a small needle is passed   into the epidural space slowly.  You may experience a sensation of pressure while this is being done.  The entire block usually last less than 10 minutes.  Conditions which may be treated by epidural steroids:   Low back and leg pain  Neck and arm pain  Spinal stenosis  Post-laminectomy syndrome  Herpes zoster (shingles) pain  Pain from compression fractures  Preparation for the injection:  1. Do not eat any solid food or dairy products within 6 hours of your appointment.  2. You may drink clear liquids up to 2 hours before appointment.  Clear liquids include water, black coffee, juice or soda.  No milk or cream please. 3. You may take your regular medication, including pain medications, with a sip of water before your appointment  Diabetics should hold regular insulin (if taken separately) and take 1/2 normal NPH dos the morning of the procedure.  Carry some sugar containing items with you to your appointment. 4. A driver must accompany you and be prepared to drive you home after your procedure.  5. Bring all your current medications with your. 6. An IV may be inserted and sedation may be given at the discretion of the  physician.   7. A blood pressure cuff, EKG and other monitors will often be applied during the procedure.  Some patients may need to have extra oxygen administered for a short period. 8. You will be asked to provide medical information, including your allergies, prior to the procedure.  We must know immediately if you are taking blood thinners (like Coumadin/Warfarin)  Or if you are allergic to IV iodine contrast (dye). We must know if you could possible be pregnant.  Possible side-effects:  Bleeding from needle site  Infection (rare, may require surgery)  Nerve injury (rare)  Numbness & tingling (temporary)  Difficulty urinating (rare, temporary)  Spinal headache ( a headache worse with upright posture)  Light -headedness (temporary)  Pain at injection site (several days)  Decreased blood pressure (temporary)  Weakness in arm/leg (temporary)  Pressure sensation in back/neck (temporary)  Call if you experience:  Fever/chills associated with headache or increased back/neck pain.  Headache worsened by an upright position.  New onset weakness or numbness of an extremity below the injection site  Hives or difficulty breathing (go to the emergency room)  Inflammation or drainage at the infection site  Severe back/neck pain  Any new symptoms which are concerning to you  Please note:  Although the local anesthetic injected can often make your back or neck feel good for several hours after the injection, the pain will likely return.  It takes 3-7 days for steroids to work in the epidural space.  You may not notice any pain relief for at least that one week.  If effective, we will often do a series of three injections spaced 3-6 weeks apart to maximally decrease your pain.  After the initial series, we generally will wait several months before considering a repeat injection of the same type.  If you have any questions, please call [phone] [facility] 

## 2015-07-08 ENCOUNTER — Encounter: Payer: Self-pay | Admitting: Pain Medicine

## 2015-07-08 DIAGNOSIS — G8929 Other chronic pain: Secondary | ICD-10-CM | POA: Insufficient documentation

## 2015-07-08 DIAGNOSIS — M5126 Other intervertebral disc displacement, lumbar region: Secondary | ICD-10-CM | POA: Insufficient documentation

## 2015-07-08 DIAGNOSIS — M5136 Other intervertebral disc degeneration, lumbar region: Secondary | ICD-10-CM | POA: Insufficient documentation

## 2015-07-08 DIAGNOSIS — M51369 Other intervertebral disc degeneration, lumbar region without mention of lumbar back pain or lower extremity pain: Secondary | ICD-10-CM | POA: Insufficient documentation

## 2015-07-08 DIAGNOSIS — M5416 Radiculopathy, lumbar region: Secondary | ICD-10-CM

## 2015-07-08 NOTE — Progress Notes (Signed)
Patient's Name: John Randall MRN: [id number] DOB: September 03, 1980 DOS: 07/04/2015  Primary Reason(s) for Visit: Encounter for Medication Management CC: Back Pain   HPI:    John Randall is a 35 y.o. year old, male patient, who returns today as an established patient. He has Adjustment disorder with depressed mood; Chronic pain of lower extremity (Location of Primary Source of Pain) (Bilateral) (L>R); Chronic radicular pain of lower extremity (Left) (L5 Dermatoma); Chronic low back pain (Midline) (L>R); Long term current use of opiate analgesic; Long term prescription opiate use; Opiate use; Encounter for therapeutic drug level monitoring; Encounter for chronic pain management; Depression; L4-5 disc bulge; Pain management; Chronic pain; and Abnormal MRI, lumbar spine (05/31/14) on his problem list.. His primarily concern today is the Back Pain     The patient comes into clinic today for pharmacological management of his chronic pain. According to the patient he had an MRI of the right hip done on 05/31/2014. This was followed by a lumbar spine MRI also done on 05/31/2014. In addition, the patient has regular x-rays of the hip and the lumbar spine also recorded to have been done on 05/31/2014. Patient indicates that his primary pain is in the lower extremity (left side) going down to the top of the foot in what seems to be an L5 dermatomal distribution. Is secondary pain is the lower back with pain on both sides but the left is worse than the right. He also has some pain in the midline. He has had only a bilateral L5 transforaminal epidural steroid injection done at [facility] that apparently did provide him with some relief of pain, but he never went back for follow-up. Today we have given him some useful techniques to manage his chronic pain including "preemptive analgesia technique".  Today's Pain Score: 3 , clinically he looks like a 1/10. Reported level of pain is incompatible with clinical obrservations. This  may be secondary to a possible lack of understanding on how the pain scale works. Pain Type: Chronic pain Pain Location: Back Pain Orientation: Lower Pain Descriptors / Indicators: Sharp, Burning Pain Frequency: Constant  Date of Last Visit:  (seen at [facility])    Pharmacotherapy Review:   Side-effects or Adverse reactions: None reported Effectiveness: Described as relatively effective, allowing for increase in activities of daily living (ADL) Onset of action: Within expected pharmacological parameters Duration of action: Within normal limits for medication Peak effect: Timing and results are as within normal expected parameters [place] PMP: Compliant with practice rules and regulations UDS Results: No UDS available, at this time UDS Interpretation: No UDS available, at this time Medication Assessment Form: Reviewed. Patient indicates being compliant with therapy Treatment compliance: Compliant. However he has a very poor track record when he comes to follow-up visits. Substance Use Disorder (SUD) Risk Level: High. According to a UDS done on 08/05/2014, he was positive for cannabinoids. Pharmacologic Plan: Continue therapy as is  Lab Work: Illicit Drugs Lab Results  Component Value Date   THCU POSITIVE 08/05/2014   PCPSCRNUR NEGATIVE 08/05/2014   MDMA NEGATIVE 08/05/2014   AMPHETMU NEGATIVE 08/05/2014   METHADONE NEGATIVE 08/05/2014   ETOH < 3 08/05/2014    Inflammation Markers No results found for: ESRSEDRATE, CRP  Renal Function Lab Results  Component Value Date   BUN 13 08/05/2014   CREATININE 0.93 08/05/2014   GFRAA >60 08/05/2014   GFRNONAA >60 08/05/2014    Hepatic Function Lab Results  Component Value Date   AST 38* 08/05/2014   ALT  53 08/05/2014   ALBUMIN 4.2 08/05/2014    Electrolytes Lab Results  Component Value Date   NA 142 08/05/2014   K 3.6 08/05/2014   CL 110* 08/05/2014   CALCIUM 8.8 08/05/2014    Allergies:  John Randall has No Known  Allergies.  Meds:  The patient has a current medication list which includes the following prescription(s): oxycodone-acetaminophen. Requested Prescriptions   Signed Prescriptions Disp Refills  . oxyCODONE-acetaminophen (PERCOCET/ROXICET) 5-325 MG tablet 120 tablet 0    Sig: Take 1 tablet by mouth every 6 (six) hours as needed for moderate pain or severe pain.    ROS:  Constitutional: Afebrile, no chills, well hydrated and well nourished Gastrointestinal: negative Musculoskeletal:negative Neurological: negative Behavioral/Psych: negative  PFSH:  Medical:  John Randall  has a past medical history of Acid reflux and Sleep apnea. Family: family history includes Heart disease in his father. Surgical:  has past surgical history that includes Eye surgery. Tobacco:  reports that he has quit smoking. He does not have any smokeless tobacco history on file. Alcohol:  reports that he does not drink alcohol. Drug:  reports that he does not use illicit drugs.  Physical Exam:  Vitals:  Today's Vitals   07/04/15 1353 07/04/15 1355  BP: 129/81   Pulse: 86   Temp: 97.9 F (36.6 C)   Resp: 18   Height: 6' (1.829 m)   Weight: 220 lb (99.791 kg)   SpO2: 99%   PainSc: 3  3   PainLoc: Back   Calculated BMI: Body mass index is 29.83 kg/(m^2). General appearance: alert, cooperative, appears stated age and no distress Eyes: PERLA Respiratory: No evidence respiratory distress, no audible rales or ronchi and no use of accessory muscles of respiration Neck: no adenopathy, no carotid bruit, no JVD, supple, symmetrical, trachea midline and thyroid not enlarged, symmetric, no tenderness/mass/nodules  Cervical Spine ROM: Adequate for flexion, extension, rotation, and lateral bending Palpation: No palpable trigger points  Upper Extremities ROM: Adequate bilaterally Strength: 5/5 for all flexors and extensors of the upper extremity, bilaterally Pulses: Palpable bilaterally Neurologic: No allodynia,  No hyperesthesia, No hyperpathia and No sensory abnormalities  Lumbar Spine ROM: Adequate for flexion, extension, rotation, and lateral bending Palpation: No palpable trigger points Lumbar Hyperextension and rotation: Non-contributory Patrick's Maneuver: Non-contributory  Lower Extremities ROM: Adequate bilaterally Strength: 5/5 for all flexors and extensors of the lower extremity, bilaterally Pulses: Palpable bilaterally Neurologic: No allodynia, No hyperesthesia, No hyperpathia, No sensory abnormalities and No antalgic gait or posture  Assessment:  Encounter Diagnosis:  Primary Diagnosis: Chronic pain [G89.29]  Plan:   Interventional Therapies: Left-sided L4-5 transforaminal epidural steroid injection under fluoroscopic guidance and IV sedation.    John Randall was seen today for back pain.  Diagnoses and all orders for this visit:  Chronic pain -     Cancel: Comprehensive metabolic panel -     Cancel: C-reactive protein -     Cancel: Magnesium -     Cancel: Sedimentation rate -     Vitamin B12 -     Vitamin D pnl(25-hydrxy+1,25-dihy)-bld -     oxyCODONE-acetaminophen (PERCOCET/ROXICET) 5-325 MG tablet; Take 1 tablet by mouth every 6 (six) hours as needed for moderate pain or severe pain.  Abnormal MRI, lumbar spine (05/31/14)  Chronic radicular pain of lower extremity (Left) (L5 Dermatoma) -     EPIDURAL STEROID INJECTION; Future  L4-5 disc bulge  Chronic low back pain (Midline) (L>R)  Chronic pain of lower extremity, left  Encounter for  chronic pain management  Encounter for therapeutic drug level monitoring  Long term current use of opiate analgesic -     Drugs of abuse screen w/o alc, rtn urine-sln  Opiate use  Pain management     Patient Instructions   GENERAL RISKS AND COMPLICATIONS  What are the risk, side effects and possible complications? Generally speaking, most procedures are safe.  However, with any procedure there are risks, side effects, and  the possibility of complications.  The risks and complications are dependent upon the sites that are lesioned, or the type of nerve block to be performed.  The closer the procedure is to the spine, the more serious the risks are.  Great care is taken when placing the radio frequency needles, block needles or lesioning probes, but sometimes complications can occur. 1. Infection: Any time there is an injection through the skin, there is a risk of infection.  This is why sterile conditions are used for these blocks.  There are four possible types of infection. 1. Localized skin infection. 2. Central Nervous System Infection-This can be in the form of Meningitis, which can be deadly. 3. Epidural Infections-This can be in the form of an epidural abscess, which can cause pressure inside of the spine, causing compression of the spinal cord with subsequent paralysis. This would require an emergency surgery to decompress, and there are no guarantees that the patient would recover from the paralysis. 4. Discitis-This is an infection of the intervertebral discs.  It occurs in about 1% of discography procedures.  It is difficult to treat and it may lead to surgery.        2. Pain: the needles have to go through skin and soft tissues, will cause soreness.       3. Damage to internal structures:  The nerves to be lesioned may be near blood vessels or    other nerves which can be potentially damaged.       4. Bleeding: Bleeding is more common if the patient is taking blood thinners such as  aspirin, Coumadin, Ticiid, Plavix, etc., or if he/she have some genetic predisposition  such as hemophilia. Bleeding into the spinal canal can cause compression of the spinal  cord with subsequent paralysis.  This would require an emergency surgery to  decompress and there are no guarantees that the patient would recover from the  paralysis.       5. Pneumothorax:  Puncturing of a lung is a possibility, every time a needle is  introduced in  the area of the chest or upper back.  Pneumothorax refers to free air around the  collapsed lung(s), inside of the thoracic cavity (chest cavity).  Another two possible  complications related to a similar event would include: Hemothorax and Chylothorax.   These are variations of the Pneumothorax, where instead of air around the collapsed  lung(s), you may have blood or chyle, respectively.       6. Spinal headaches: They may occur with any procedures in the area of the spine.       7. Persistent CSF (Cerebro-Spinal Fluid) leakage: This is a rare problem, but may occur  with prolonged intrathecal or epidural catheters either due to the formation of a fistulous  track or a dural tear.       8. Nerve damage: By working so close to the spinal cord, there is always a possibility of  nerve damage, which could be as serious as a permanent spinal cord injury with  paralysis.       9. Death:  Although rare, severe deadly allergic reactions known as "Anaphylactic  reaction" can occur to any of the medications used.      10. Worsening of the symptoms:  We can always make thing worse.  What are the chances of something like this happening? Chances of any of this occuring are extremely low.  By statistics, you have more of a chance of getting killed in a motor vehicle accident: while driving to the hospital than any of the above occurring .  Nevertheless, you should be aware that they are possibilities.  In general, it is similar to taking a shower.  Everybody knows that you can slip, hit your head and get killed.  Does that mean that you should not shower again?  Nevertheless always keep in mind that statistics do not mean anything if you happen to be on the wrong side of them.  Even if a procedure has a 1 (one) in a 1,000,000 (million) chance of going wrong, it you happen to be that one..Also, keep in mind that by statistics, you have more of a chance of having something go wrong when taking  medications.  Who should not have this procedure? If you are on a blood thinning medication (e.g. Coumadin, Plavix, see list of "Blood Thinners"), or if you have an active infection going on, you should not have the procedure.  If you are taking any blood thinners, please inform your physician.  How should I prepare for this procedure?  Do not eat or drink anything at least six hours prior to the procedure.  Bring a driver with you .  It cannot be a taxi.  Come accompanied by an adult that can drive you back, and that is strong enough to help you if your legs get weak or numb from the local anesthetic.  Take all of your medicines the morning of the procedure with just enough water to swallow them.  If you have diabetes, make sure that you are scheduled to have your procedure done first thing in the morning, whenever possible.  If you have diabetes, take only half of your insulin dose and notify our nurse that you have done so as soon as you arrive at the clinic.  If you are diabetic, but only take blood sugar pills (oral hypoglycemic), then do not take them on the morning of your procedure.  You may take them after you have had the procedure.  Do not take aspirin or any aspirin-containing medications, at least eleven (11) days prior to the procedure.  They may prolong bleeding.  Wear loose fitting clothing that may be easy to take off and that you would not mind if it got stained with Betadine or blood.  Do not wear any jewelry or perfume  Remove any nail coloring.  It will interfere with some of our monitoring equipment.  NOTE: Remember that this is not meant to be interpreted as a complete list of all possible complications.  Unforeseen problems may occur.  BLOOD THINNERS The following drugs contain aspirin or other products, which can cause increased bleeding during surgery and should not be taken for 2 weeks prior to and 1 week after surgery.  If you should need take something for  relief of minor pain, you may take acetaminophen which is found in Tylenol,m Datril, Anacin-3 and Panadol. It is not blood thinner. The products listed below are.  Do not take any of the products listed  below in addition to any listed on your instruction sheet.  A.P.C or A.P.C with Codeine Codeine Phosphate Capsules #3 Ibuprofen Ridaura  ABC compound Congesprin Imuran rimadil  Advil Cope Indocin Robaxisal  Alka-Seltzer Effervescent Pain Reliever and Antacid Coricidin or Coricidin-D  Indomethacin Rufen  Alka-Seltzer plus Cold Medicine Cosprin Ketoprofen S-A-C Tablets  Anacin Analgesic Tablets or Capsules Coumadin Korlgesic Salflex  Anacin Extra Strength Analgesic tablets or capsules CP-2 Tablets Lanoril Salicylate  Anaprox Cuprimine Capsules Levenox Salocol  Anexsia-D Dalteparin Magan Salsalate  Anodynos Darvon compound Magnesium Salicylate Sine-off  Ansaid Dasin Capsules Magsal Sodium Salicylate  Anturane Depen Capsules Marnal Soma  APF Arthritis pain formula Dewitt's Pills Measurin Stanback  Argesic Dia-Gesic Meclofenamic Sulfinpyrazone  Arthritis Bayer Timed Release Aspirin Diclofenac Meclomen Sulindac  Arthritis pain formula Anacin Dicumarol Medipren Supac  Analgesic (Safety coated) Arthralgen Diffunasal Mefanamic Suprofen  Arthritis Strength Bufferin Dihydrocodeine Mepro Compound Suprol  Arthropan liquid Dopirydamole Methcarbomol with Aspirin Synalgos  ASA tablets/Enseals Disalcid Micrainin Tagament  Ascriptin Doan's Midol Talwin  Ascriptin A/D Dolene Mobidin Tanderil  Ascriptin Extra Strength Dolobid Moblgesic Ticlid  Ascriptin with Codeine Doloprin or Doloprin with Codeine Momentum Tolectin  Asperbuf Duoprin Mono-gesic Trendar  Aspergum Duradyne Motrin or Motrin IB Triminicin  Aspirin plain, buffered or enteric coated Durasal Myochrisine Trigesic  Aspirin Suppositories Easprin Nalfon Trillsate  Aspirin with Codeine Ecotrin Regular or Extra Strength Naprosyn Uracel  Atromid-S  Efficin Naproxen Ursinus  Auranofin Capsules Elmiron Neocylate Vanquish  Axotal Emagrin Norgesic Verin  Azathioprine Empirin or Empirin with Codeine Normiflo Vitamin E  Azolid Emprazil Nuprin Voltaren  Bayer Aspirin plain, buffered or children's or timed BC Tablets or powders Encaprin Orgaran Warfarin Sodium  Buff-a-Comp Enoxaparin Orudis Zorpin  Buff-a-Comp with Codeine Equegesic Os-Cal-Gesic   Buffaprin Excedrin plain, buffered or Extra Strength Oxalid   Bufferin Arthritis Strength Feldene Oxphenbutazone   Bufferin plain or Extra Strength Feldene Capsules Oxycodone with Aspirin   Bufferin with Codeine Fenoprofen Fenoprofen Pabalate or Pabalate-SF   Buffets II Flogesic Panagesic   Buffinol plain or Extra Strength Florinal or Florinal with Codeine Panwarfarin   Buf-Tabs Flurbiprofen Penicillamine   Butalbital Compound Four-way cold tablets Penicillin   Butazolidin Fragmin Pepto-Bismol   Carbenicillin Geminisyn Percodan   Carna Arthritis Reliever Geopen Persantine   Carprofen Gold's salt Persistin   Chloramphenicol Goody's Phenylbutazone   Chloromycetin Haltrain Piroxlcam   Clmetidine heparin Plaquenil   Cllnoril Hyco-pap Ponstel   Clofibrate Hydroxy chloroquine Propoxyphen         Before stopping any of these medications, be sure to consult the physician who ordered them.  Some, such as Coumadin (Warfarin) are ordered to prevent or treat serious conditions such as "deep thrombosis", "pumonary embolisms", and other heart problems.  The amount of time that you may need off of the medication may also vary with the medication and the reason for which you were taking it.  If you are taking any of these medications, please make sure you notify your pain physician before you undergo any procedures.         Epidural Steroid Injection Patient Information  Description: The epidural space surrounds the nerves as they exit the spinal cord.  In some patients, the nerves can be compressed  and inflamed by a bulging disc or a tight spinal canal (spinal stenosis).  By injecting steroids into the epidural space, we can bring irritated nerves into direct contact with a potentially helpful medication.  These steroids act directly on the irritated nerves and can reduce swelling and inflammation  which often leads to decreased pain.  Epidural steroids may be injected anywhere along the spine and from the neck to the low back depending upon the location of your pain.   After numbing the skin with local anesthetic (like Novocaine), a small needle is passed into the epidural space slowly.  You may experience a sensation of pressure while this is being done.  The entire block usually last less than 10 minutes.  Conditions which may be treated by epidural steroids:   Low back and leg pain  Neck and arm pain  Spinal stenosis  Post-laminectomy syndrome  Herpes zoster (shingles) pain  Pain from compression fractures  Preparation for the injection:  1. Do not eat any solid food or dairy products within 6 hours of your appointment.  2. You may drink clear liquids up to 2 hours before appointment.  Clear liquids include water, black coffee, juice or soda.  No milk or cream please. 3. You may take your regular medication, including pain medications, with a sip of water before your appointment  Diabetics should hold regular insulin (if taken separately) and take 1/2 normal NPH dos the morning of the procedure.  Carry some sugar containing items with you to your appointment. 4. A driver must accompany you and be prepared to drive you home after your procedure.  5. Bring all your current medications with your. 6. An IV may be inserted and sedation may be given at the discretion of the physician.   7. A blood pressure cuff, EKG and other monitors will often be applied during the procedure.  Some patients may need to have extra oxygen administered for a short period. 8. You will be asked to provide  medical information, including your allergies, prior to the procedure.  We must know immediately if you are taking blood thinners (like Coumadin/Warfarin)  Or if you are allergic to IV iodine contrast (dye). We must know if you could possible be pregnant.  Possible side-effects:  Bleeding from needle site  Infection (rare, may require surgery)  Nerve injury (rare)  Numbness & tingling (temporary)  Difficulty urinating (rare, temporary)  Spinal headache ( a headache worse with upright posture)  Light -headedness (temporary)  Pain at injection site (several days)  Decreased blood pressure (temporary)  Weakness in arm/leg (temporary)  Pressure sensation in back/neck (temporary)  Call if you experience:  Fever/chills associated with headache or increased back/neck pain.  Headache worsened by an upright position.  New onset weakness or numbness of an extremity below the injection site  Hives or difficulty breathing (go to the emergency room)  Inflammation or drainage at the infection site  Severe back/neck pain  Any new symptoms which are concerning to you  Please note:  Although the local anesthetic injected can often make your back or neck feel good for several hours after the injection, the pain will likely return.  It takes 3-7 days for steroids to work in the epidural space.  You may not notice any pain relief for at least that one week.  If effective, we will often do a series of three injections spaced 3-6 weeks apart to maximally decrease your pain.  After the initial series, we generally will wait several months before considering a repeat injection of the same type.  If you have any questions, please call [phone] [facility]  Medications discontinued today:  Medications Discontinued During This Encounter  Medication Reason  . guaiFENesin-codeine (ROBITUSSIN AC) 100-10 MG/5ML syrup  Error  . HYDROcodone-acetaminophen  (NORCO/VICODIN) 5-325 MG per tablet Error  . oxyCODONE (OXY IR/ROXICODONE) 5 MG immediate release tablet Error  . oxyCODONE-acetaminophen (PERCOCET/ROXICET) 5-325 MG tablet Reorder  . oxyCODONE-acetaminophen (PERCOCET/ROXICET) 5-325 MG tablet Reorder   Medications administered today:  John Randall had no medications administered during this visit.  Primary Care Physician: No primary care provider on file. Location: [facility] Note by: Bijal Siglin A. Laban Emperor, M.D, DABA, DABAPM, DABPM, DABIPP, FIPP

## 2015-07-12 LAB — TOXASSURE SELECT 13 (MW), URINE: PDF: 0

## 2015-08-01 ENCOUNTER — Ambulatory Visit: Payer: BLUE CROSS/BLUE SHIELD | Admitting: Pain Medicine

## 2015-08-15 ENCOUNTER — Ambulatory Visit: Payer: BLUE CROSS/BLUE SHIELD | Admitting: Pain Medicine

## 2015-10-25 ENCOUNTER — Ambulatory Visit
Admission: AD | Admit: 2015-10-25 | Discharge: 2015-10-25 | Disposition: A | Discharge status: Home / Self Care | Payer: BLUE CROSS/BLUE SHIELD | Source: Ambulatory Visit | Attending: Physician Assistant | Admitting: Physician Assistant

## 2015-10-25 ENCOUNTER — Other Ambulatory Visit: Payer: Self-pay | Admitting: Physician Assistant

## 2015-10-25 ENCOUNTER — Ambulatory Visit
Admission: RE | Admit: 2015-10-25 | Discharge: 2015-10-25 | Disposition: A | Discharge status: Home / Self Care | Payer: BLUE CROSS/BLUE SHIELD | Source: Ambulatory Visit | Attending: Physician Assistant | Admitting: Physician Assistant

## 2015-10-25 DIAGNOSIS — S3992XA Unspecified injury of lower back, initial encounter: Secondary | ICD-10-CM | POA: Diagnosis not present

## 2015-10-25 DIAGNOSIS — X19XXXA Contact with other heat and hot substances, initial encounter: Secondary | ICD-10-CM | POA: Diagnosis not present

## 2015-10-25 DIAGNOSIS — T148 Other injury of unspecified body region: Secondary | ICD-10-CM | POA: Diagnosis not present

## 2015-10-25 DIAGNOSIS — N200 Calculus of kidney: Secondary | ICD-10-CM | POA: Insufficient documentation

## 2015-10-25 DIAGNOSIS — M5137 Other intervertebral disc degeneration, lumbosacral region: Secondary | ICD-10-CM | POA: Insufficient documentation

## 2015-10-25 DIAGNOSIS — R609 Edema, unspecified: Secondary | ICD-10-CM

## 2015-10-25 DIAGNOSIS — W19XXXA Unspecified fall, initial encounter: Secondary | ICD-10-CM

## 2015-10-25 DIAGNOSIS — T148XXA Other injury of unspecified body region, initial encounter: Secondary | ICD-10-CM

## 2015-10-25 DIAGNOSIS — M5106 Intervertebral disc disorders with myelopathy, lumbar region: Secondary | ICD-10-CM | POA: Diagnosis not present

## 2015-10-25 DIAGNOSIS — M545 Low back pain: Secondary | ICD-10-CM | POA: Diagnosis not present

## 2016-01-29 DIAGNOSIS — R197 Diarrhea, unspecified: Secondary | ICD-10-CM | POA: Diagnosis not present

## 2016-03-21 ENCOUNTER — Emergency Department
Admission: EM | Admit: 2016-03-21 | Discharge: 2016-03-21 | Disposition: A | Discharge status: Home / Self Care | Payer: BLUE CROSS/BLUE SHIELD | Attending: Emergency Medicine | Admitting: Emergency Medicine

## 2016-03-21 DIAGNOSIS — W57XXXA Bitten or stung by nonvenomous insect and other nonvenomous arthropods, initial encounter: Secondary | ICD-10-CM

## 2016-03-21 DIAGNOSIS — Z87891 Personal history of nicotine dependence: Secondary | ICD-10-CM | POA: Insufficient documentation

## 2016-03-21 DIAGNOSIS — T783XXA Angioneurotic edema, initial encounter: Secondary | ICD-10-CM | POA: Insufficient documentation

## 2016-03-21 DIAGNOSIS — T63461A Toxic effect of venom of wasps, accidental (unintentional), initial encounter: Secondary | ICD-10-CM | POA: Diagnosis present

## 2016-03-21 DIAGNOSIS — T7840XA Allergy, unspecified, initial encounter: Secondary | ICD-10-CM

## 2016-03-21 DIAGNOSIS — S00261A Insect bite (nonvenomous) of right eyelid and periocular area, initial encounter: Secondary | ICD-10-CM | POA: Diagnosis not present

## 2016-03-21 MED ORDER — PREDNISONE 20 MG PO TABS: 20.0000 mg | ORAL_TABLET | Freq: Every day | ORAL | 0 refills | Status: AC

## 2016-03-21 MED ORDER — CEPHALEXIN 500 MG PO CAPS
500.0000 mg | ORAL_CAPSULE | Freq: Once | ORAL | Status: AC
  Administered 2016-03-21: 500 mg via ORAL
  Filled 2016-03-21: qty 1

## 2016-03-21 MED ORDER — CEPHALEXIN 500 MG PO CAPS: 500.0000 mg | ORAL_CAPSULE | Freq: Two times a day (BID) | ORAL | 0 refills | Status: AC

## 2016-03-21 MED ORDER — PREDNISONE 20 MG PO TABS: 60.0000 mg | ORAL_TABLET | Freq: Every day | ORAL | 0 refills | Status: AC

## 2016-03-21 MED ORDER — DIPHENHYDRAMINE HCL 25 MG PO CAPS
50.0000 mg | ORAL_CAPSULE | Freq: Once | ORAL | Status: AC
  Administered 2016-03-21: 50 mg via ORAL
  Filled 2016-03-21: qty 2

## 2016-03-21 MED ORDER — PREDNISONE 20 MG PO TABS
60.0000 mg | ORAL_TABLET | Freq: Once | ORAL | Status: AC
  Administered 2016-03-21: 60 mg via ORAL
  Filled 2016-03-21: qty 3

## 2016-03-21 MED ORDER — CEPHALEXIN 500 MG PO CAPS
500.0000 mg | ORAL_CAPSULE | Freq: Two times a day (BID) | ORAL | 0 refills | Status: AC
Start: 2016-03-21 — End: 2016-03-31

## 2016-03-21 NOTE — ED Triage Notes (Signed)
Pt ambulatory to triage with no difficulty. Pt reports around 1030 am on Friday he was stung by what he thinks was a wasp below his right eye. Pt has swelling around his right eye.

## 2016-03-22 NOTE — ED Provider Notes (Signed)
[facility] Emergency Department Provider Note _   First MD Initiated Contact with Patient 03/21/16 [phone]     (approximate)  I have reviewed the triage vital signs and the nursing notes.   HISTORY  Chief Complaint Insect Bite    HPI John Randall is a 35 y.o. male resents a right facial swelling after being stung by a wasp at approximately 10:30 AM yesterday. Patient states that he's noted progressive right facial swelling since that time. Patient denies any difficulty breathing no difficulty swallowing.   Past Medical History:  Diagnosis Date  . Acid reflux   . L4-5 disc bulge 06/27/2015  . Sleep apnea     Patient Active Problem List   Diagnosis Date Noted  . Chronic lumbar radicular pain (Left) (L5 dermatome) 07/08/2015  . L4-5 Disc Herniation (large right paracentral and lateral recess disc protrusion with caudal extension impinging on the descending right L5 nerve root) 07/08/2015  . Lumbar bulging disks (L3-4, L4-L5, and L5-S1) 07/08/2015  . Chronic pain 07/04/2015  . Abnormal MRI, lumbar spine (05/31/14) 07/04/2015  . Adjustment disorder with depressed mood 06/27/2015  . Chronic pain of lower extremity (Location of Primary Source of Pain) (Bilateral) (L>R) 06/27/2015  . Chronic radicular pain of lower extremity (Left) (L5 Dermatoma) 06/27/2015  . Chronic low back pain (Midline) (L>R) 06/27/2015  . Long term current use of opiate analgesic 06/27/2015  . Long term prescription opiate use 06/27/2015  . Opiate use 06/27/2015  . Encounter for therapeutic drug level monitoring 06/27/2015  . Encounter for chronic pain management 06/27/2015  . Depression 06/27/2015  . Pain management 06/27/2015    Past Surgical History:  Procedure Laterality Date  . EYE SURGERY      Prior to Admission medications   Medication Sig Start Date End Date Taking? Authorizing Provider  cephALEXin (KEFLEX) 500 MG capsule Take 1 capsule (500 mg total) by mouth  2 (two) times daily. 03/21/16 03/31/16  Darci Current, MD  cephALEXin (KEFLEX) 500 MG capsule Take 1 capsule (500 mg total) by mouth 2 (two) times daily. 03/21/16 03/31/16  Darci Current, MD  oxyCODONE-acetaminophen (PERCOCET/ROXICET) 5-325 MG tablet Take 1 tablet by mouth every 6 (six) hours as needed for moderate pain or severe pain. 07/04/15   Delano Metz, MD  predniSONE (DELTASONE) 20 MG tablet Take 1 tablet (20 mg total) by mouth daily. 03/21/16 03/25/16  Darci Current, MD  predniSONE (DELTASONE) 20 MG tablet Take 3 tablets (60 mg total) by mouth daily. 03/21/16 03/25/16  Darci Current, MD    Allergies No known drug allergies  Family History  Problem Relation Age of Onset  . Heart disease Father     Social History Social History  Substance Use Topics  . Smoking status: Former Games developer  . Smokeless tobacco: Not on file  . Alcohol use No    Review of Systems Constitutional: No fever/chills Eyes: No visual changes. ENT: No sore throat. Cardiovascular: Denies chest pain. Respiratory: Denies shortness of breath. Gastrointestinal: No abdominal pain.  No nausea, no vomiting.  No diarrhea.  No constipation. Genitourinary: Negative for dysuria. Musculoskeletal: Negative for back pain. Skin: Negative for rash.Positive for right facial swelling Neurological: Negative for headaches, focal weakness or numbness.  10-point ROS otherwise negative.  ____________________________________________   PHYSICAL EXAM:  VITAL SIGNS: ED Triage Vitals  Enc Vitals Group     BP 03/21/16 0322 121/77     Pulse Rate 03/21/16 0322 66     Resp  03/21/16 0322 18     Temp 03/21/16 0322 97.7 F (36.5 C)     Temp Source 03/21/16 0322 Oral     SpO2 03/21/16 0322 98 %     Weight 03/21/16 0322 215 lb (97.5 kg)     Height 03/21/16 0322 6' (1.829 m)     Head Circumference --      Peak Flow --      Pain Score 03/21/16 0323 0     Pain Loc --      Pain Edu? --      Excl. in GC? --      Constitutional: Alert and oriented. Well appearing and in no acute distress. Eyes: Conjunctivae are normal. PERRL. Painless EOMI. periorbital swelling Head: Atraumatic. Nose: No congestion/rhinnorhea. Mouth/Throat: Mucous membranes are moist.  Oropharynx non-erythematous. Neck: No stridor.  No meningeal signs.   Cardiovascular: Normal rate, regular rhythm. Good peripheral circulation. Grossly normal heart sounds. Respiratory: Normal respiratory effort.  No retractions. Lungs CTAB. Gastrointestinal: Soft and nontender. No distention.  Musculoskeletal: No lower extremity tenderness nor edema. No gross deformities of extremities. Neurologic:  Normal speech and language. No gross focal neurologic deficits are appreciated.  Skin: Right facial swelling extending to the periorbital region down to the right mandibular angle, positive erythema Psychiatric: Mood and affect are normal. Speech and behavior are normal.       Procedures     INITIAL IMPRESSION / ASSESSMENT AND PLAN / ED COURSE  Pertinent labs & imaging results that were available during my care of the patient were reviewed by me and considered in my medical decision making (see chart for details).  Patient given Keflex in the emergency department as well as prednisone and Benadryl. Patient will be prescribed Keflex and prednisone for home.   Clinical Course    ____________________________________________  FINAL CLINICAL IMPRESSION(S) / ED DIAGNOSES  Final diagnoses:  Insect bites  Allergic reaction, initial encounter  Angioedema, initial encounter     MEDICATIONS GIVEN DURING THIS VISIT:  Medications  predniSONE (DELTASONE) tablet 60 mg (60 mg Oral Given 03/21/16 0540)  diphenhydrAMINE (BENADRYL) capsule 50 mg (50 mg Oral Given 03/21/16 0541)  cephALEXin (KEFLEX) capsule 500 mg (500 mg Oral Given 03/21/16 0541)     NEW OUTPATIENT MEDICATIONS STARTED DURING THIS VISIT:  Discharge Medication List as of  03/21/2016  5:42 AM    START taking these medications   Details  cephALEXin (KEFLEX) 500 MG capsule Take 1 capsule (500 mg total) by mouth 2 (two) times daily., Starting Sat 03/21/2016, Until Tue 03/31/2016, Print    predniSONE (DELTASONE) 20 MG tablet Take 1 tablet (20 mg total) by mouth daily., Starting Sat 03/21/2016, Until Wed 03/25/2016, Print        Discharge Medication List as of 03/21/2016  5:42 AM      Discharge Medication List as of 03/21/2016  5:42 AM       Note:  This document was prepared using Dragon voice recognition software and may include unintentional dictation errors.    Darci Currentandolph N Phill Steck, MD 03/22/16 [phone]

## 2016-06-11 DIAGNOSIS — J019 Acute sinusitis, unspecified: Secondary | ICD-10-CM | POA: Diagnosis not present

## 2016-08-25 DIAGNOSIS — R0683 Snoring: Secondary | ICD-10-CM | POA: Diagnosis not present

## 2016-08-25 DIAGNOSIS — G4710 Hypersomnia, unspecified: Secondary | ICD-10-CM | POA: Diagnosis not present

## 2016-08-25 DIAGNOSIS — B009 Herpesviral infection, unspecified: Secondary | ICD-10-CM | POA: Diagnosis not present

## 2016-09-08 DIAGNOSIS — G4710 Hypersomnia, unspecified: Secondary | ICD-10-CM | POA: Diagnosis not present

## 2016-11-30 DIAGNOSIS — M79642 Pain in left hand: Secondary | ICD-10-CM | POA: Diagnosis not present

## 2016-11-30 DIAGNOSIS — M7989 Other specified soft tissue disorders: Secondary | ICD-10-CM | POA: Diagnosis not present

## 2016-12-28 DIAGNOSIS — G4710 Hypersomnia, unspecified: Secondary | ICD-10-CM | POA: Diagnosis not present

## 2016-12-28 DIAGNOSIS — R0683 Snoring: Secondary | ICD-10-CM | POA: Diagnosis not present

## 2017-02-25 DIAGNOSIS — M79642 Pain in left hand: Secondary | ICD-10-CM | POA: Diagnosis not present

## 2017-02-25 DIAGNOSIS — R2241 Localized swelling, mass and lump, right lower limb: Secondary | ICD-10-CM | POA: Diagnosis not present

## 2017-02-25 DIAGNOSIS — M545 Low back pain: Secondary | ICD-10-CM | POA: Diagnosis not present

## 2017-03-15 ENCOUNTER — Other Ambulatory Visit: Payer: Self-pay | Admitting: Nurse Practitioner

## 2017-03-15 ENCOUNTER — Ambulatory Visit
Admission: RE | Admit: 2017-03-15 | Discharge: 2017-03-15 | Disposition: A | Discharge status: Home / Self Care | Payer: BLUE CROSS/BLUE SHIELD | Source: Ambulatory Visit | Attending: Nurse Practitioner | Admitting: Nurse Practitioner

## 2017-03-15 DIAGNOSIS — M7989 Other specified soft tissue disorders: Secondary | ICD-10-CM | POA: Diagnosis not present

## 2017-03-15 DIAGNOSIS — M79642 Pain in left hand: Secondary | ICD-10-CM | POA: Insufficient documentation

## 2017-03-15 DIAGNOSIS — R52 Pain, unspecified: Secondary | ICD-10-CM

## 2017-03-15 DIAGNOSIS — M25561 Pain in right knee: Secondary | ICD-10-CM | POA: Insufficient documentation

## 2017-03-27 ENCOUNTER — Emergency Department: Payer: BLUE CROSS/BLUE SHIELD

## 2017-03-27 ENCOUNTER — Encounter: Payer: Self-pay | Admitting: *Deleted

## 2017-03-27 ENCOUNTER — Emergency Department
Admission: EM | Admit: 2017-03-27 | Discharge: 2017-03-27 | Disposition: A | Discharge status: Home / Self Care | Payer: BLUE CROSS/BLUE SHIELD | Attending: Emergency Medicine | Admitting: Emergency Medicine

## 2017-03-27 DIAGNOSIS — Y999 Unspecified external cause status: Secondary | ICD-10-CM | POA: Insufficient documentation

## 2017-03-27 DIAGNOSIS — Z87891 Personal history of nicotine dependence: Secondary | ICD-10-CM | POA: Diagnosis not present

## 2017-03-27 DIAGNOSIS — Y92830 Public park as the place of occurrence of the external cause: Secondary | ICD-10-CM | POA: Insufficient documentation

## 2017-03-27 DIAGNOSIS — R079 Chest pain, unspecified: Secondary | ICD-10-CM | POA: Diagnosis not present

## 2017-03-27 DIAGNOSIS — S299XXA Unspecified injury of thorax, initial encounter: Secondary | ICD-10-CM | POA: Diagnosis not present

## 2017-03-27 DIAGNOSIS — S20212A Contusion of left front wall of thorax, initial encounter: Secondary | ICD-10-CM

## 2017-03-27 DIAGNOSIS — Y9355 Activity, bike riding: Secondary | ICD-10-CM | POA: Diagnosis not present

## 2017-03-27 DIAGNOSIS — R9431 Abnormal electrocardiogram [ECG] [EKG]: Secondary | ICD-10-CM | POA: Diagnosis not present

## 2017-03-27 DIAGNOSIS — S2020XA Contusion of thorax, unspecified, initial encounter: Secondary | ICD-10-CM | POA: Diagnosis not present

## 2017-03-27 MED ORDER — IBUPROFEN 800 MG PO TABS
800.0000 mg | ORAL_TABLET | Freq: Once | ORAL | Status: AC
  Administered 2017-03-27: 800 mg via ORAL
  Filled 2017-03-27: qty 1

## 2017-03-27 MED ORDER — OXYCODONE-ACETAMINOPHEN 5-325 MG PO TABS: 1.0000 | ORAL_TABLET | ORAL | 0 refills | Status: DC | PRN
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
## 2017-03-27 NOTE — ED Notes (Signed)
Patient transported to X-ray 
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
## 2017-03-27 NOTE — Discharge Instructions (Signed)
Your x-ray showed no clear traumatic injury, but as we discussed I suspect clinical rib fracture versus rib contusion.  Return to the emergency department immediately for any worsening condition, any coughing up blood, shortness of breath, worsening chest pain.

## 2017-07-13 ENCOUNTER — Ambulatory Visit: Payer: BLUE CROSS/BLUE SHIELD | Admitting: Nurse Practitioner

## 2017-07-13 ENCOUNTER — Encounter: Payer: Self-pay | Admitting: Nurse Practitioner

## 2017-07-13 VITALS — BP 124/73 | HR 64 | Temp 96.7°F | Resp 16 | Ht 72.0 in | Wt 235.6 lb

## 2017-07-13 DIAGNOSIS — R252 Cramp and spasm: Secondary | ICD-10-CM | POA: Diagnosis not present

## 2017-07-13 DIAGNOSIS — M5126 Other intervertebral disc displacement, lumbar region: Secondary | ICD-10-CM | POA: Diagnosis not present

## 2017-07-13 NOTE — Progress Notes (Signed)
[facility] [address] [place], [place] [address]  Internal MEDICINE  Office Visit Note  Patient Name: John Randall  [id number]  [id number]  Date of Service: 07/13/2017  Chief Complaint  Patient presents with  . Leg Pain    right side going on for months     Other  This is a recurrent problem. The current episode started more than 1 month ago. The problem occurs intermittently. The problem has been gradually worsening. Associated symptoms include fatigue and myalgias. Pertinent negatives include no abdominal pain, arthralgias, chest pain, chills, congestion, coughing, joint swelling, nausea, neck pain, numbness, rash, sore throat or vomiting. The symptoms are aggravated by standing and walking (sleeping). Treatments tried: standing and stretching the legs  The treatment provided mild relief.    Pt is here for routine follow up.    Current Medication: Outpatient Encounter Medications as of 07/13/2017  Medication Sig  . oxyCODONE-acetaminophen (ROXICET) 5-325 MG tablet Take 1 tablet by mouth every 4 (four) hours as needed for severe pain. (Patient not taking: Reported on 07/13/2017)   No facility-administered encounter medications on file as of 07/13/2017.     Surgical History: Past Surgical History:  Procedure Laterality Date  . EYE SURGERY      Medical History: Past Medical History:  Diagnosis Date  . Acid reflux   . L4-5 disc bulge 06/27/2015  . Sleep apnea     Family History: Family History  Problem Relation Age of Onset  . Heart disease Father     Social History   Socioeconomic History  . Marital status: Married    Spouse name: Not on file  . Number of children: Not on file  . Years of education: Not on file  . Highest education level: Not on file  Social Needs  . Financial resource strain: Not on file  . Food insecurity - worry: Not on file  . Food insecurity - inability: Not on file  . Transportation needs - medical: Not on file  .  Transportation needs - non-medical: Not on file  Occupational History  . Not on file  Tobacco Use  . Smoking status: Former Games developer  . Smokeless tobacco: Never Used  Substance and Sexual Activity  . Alcohol use: No    Alcohol/week: 0.0 oz  . Drug use: Yes    Types: Marijuana  . Sexual activity: Not on file  Other Topics Concern  . Not on file  Social History Narrative  . Not on file      Review of Systems  Constitutional: Positive for activity change and fatigue. Negative for chills and unexpected weight change.  HENT: Negative for congestion, postnasal drip, rhinorrhea, sneezing and sore throat.   Eyes: Negative.  Negative for redness.  Respiratory: Negative for cough, chest tightness and shortness of breath.   Cardiovascular: Negative for chest pain, palpitations and leg swelling.  Gastrointestinal: Negative for abdominal pain, constipation, diarrhea, nausea and vomiting.  Genitourinary: Negative for dysuria and frequency.  Musculoskeletal: Positive for myalgias. Negative for arthralgias, back pain, joint swelling and neck pain.       Most severe in both lower legs. Feel like "charlie horses." Has to stretch out legs to cramp. No swelling or bruising present.   Skin: Negative for rash.  Neurological: Negative.  Negative for tremors and numbness.  Hematological: Negative for adenopathy. Does not bruise/bleed easily.  Psychiatric/Behavioral: Negative for behavioral problems (Depression), sleep disturbance and suicidal ideas. The patient is not nervous/anxious.     Today's  Vitals   07/13/17 1230  BP: 124/73  Pulse: 64  Resp: 16  Temp: (!) 96.7 F (35.9 C)  SpO2: 98%  Weight: 235 lb 9.6 oz (106.9 kg)  Height: 6' (1.829 m)    Physical Exam  Constitutional: He is oriented to person, place, and time. He appears well-developed and well-nourished. No distress.  HENT:  Head: Normocephalic and atraumatic.  Mouth/Throat: Oropharynx is clear and moist. No oropharyngeal  exudate.  Eyes: EOM are normal. Pupils are equal, round, and reactive to light.  Neck: Normal range of motion. Neck supple. No JVD present. No tracheal deviation present. No thyromegaly present.  Cardiovascular: Normal rate, regular rhythm and normal heart sounds. Exam reveals no gallop and no friction rub.  No murmur heard. Pulmonary/Chest: Effort normal and breath sounds normal. No respiratory distress. He has no wheezes. He has no rales. He exhibits no tenderness.  Abdominal: Soft. Bowel sounds are normal.  Musculoskeletal: Normal range of motion.  There is moderate tenderness with palpation of both legs. No deformities or abnormalities are currently noted. Patient does experience increased pain when standing up after being seated for several minutes.   Lymphadenopathy:    He has no cervical adenopathy.  Neurological: He is alert and oriented to person, place, and time. No cranial nerve deficit.  Skin: Skin is warm and dry. He is not diaphoretic.  Psychiatric: He has a normal mood and affect. His behavior is normal. Judgment and thought content normal.  Nursing note and vitals reviewed.   Assessment/Plan:  1. Cramp and spasm Discussed possible causes for leg cramps and night time charlie horses. Likely related to deficiency in potassium or magnesium, even dehydration. Patient does not wish to do blood work at this time. Recommended OTC supplementation for potassium and magnesium every day and increasing intake of water. If cramping continues for next week, will get blood work. He is agreeable to this plan.   2. L4-5 Disc Herniation (large right paracentral and lateral recess disc protrusion with caudal extension impinging on the descending right L5 nerve root) Discussed possibility that cramping could be related to herniated disc in lumbar spine. Should discuss these new symptoms with neurosurgeon if symptoms are persistent.    General Counseling: John Randall verbalizes understanding of the  findings of todays visit and agrees with plan of treatment. I have discussed any further diagnostic evaluation that may be needed or ordered today. We also reviewed his medications today. he has been encouraged to call the office with any questions or concerns that should arise related to todays visit.  This patient was seen by Vincent GrosHeather Brentton Wardlow, FNP- C in Collaboration with Dr Lyndon CodeFozia M Khan as a part of collaborative care agreement   Time spent: 15  Minutes   Dr Lyndon CodeFozia M Khan Internal medicine

## 2017-12-22 ENCOUNTER — Telehealth: Payer: Self-pay

## 2017-12-22 NOTE — Telephone Encounter (Signed)
Patient called with symptoms of gasping and choking in his sleep and it worried him and scared him, I advised patient we could see him in the morning or early afternoon bc of the severity not to do a late pm appt so we can proper work up, pt canceled and said he needs a late appt only and needs a work excuse note, I advised pt e can do early and if its that serious where its scaring him from sleeping he needs to go to ER. John Randall

## 2017-12-23 ENCOUNTER — Ambulatory Visit: Payer: Self-pay | Admitting: Internal Medicine

## 2018-03-25 ENCOUNTER — Ambulatory Visit: Payer: Self-pay | Admitting: Adult Health

## 2018-04-01 ENCOUNTER — Ambulatory Visit: Payer: BLUE CROSS/BLUE SHIELD | Admitting: Adult Health

## 2018-04-01 ENCOUNTER — Encounter: Payer: Self-pay | Admitting: Adult Health

## 2018-04-01 VITALS — BP 122/70 | HR 69 | Temp 97.4°F | Resp 16 | Ht 72.0 in | Wt 220.0 lb

## 2018-04-01 DIAGNOSIS — R062 Wheezing: Secondary | ICD-10-CM | POA: Diagnosis not present

## 2018-04-01 DIAGNOSIS — J069 Acute upper respiratory infection, unspecified: Secondary | ICD-10-CM

## 2018-04-01 DIAGNOSIS — R05 Cough: Secondary | ICD-10-CM | POA: Diagnosis not present

## 2018-04-01 DIAGNOSIS — R059 Cough, unspecified: Secondary | ICD-10-CM

## 2018-04-01 MED ORDER — GUAIFENESIN-CODEINE 100-10 MG/5ML PO SYRP: 5.0000 mL | ORAL_SOLUTION | Freq: Three times a day (TID) | ORAL | 0 refills | Status: DC | PRN

## 2018-04-01 MED ORDER — BENZONATATE 200 MG PO CAPS: 200.0000 mg | ORAL_CAPSULE | Freq: Two times a day (BID) | ORAL | 0 refills | Status: DC | PRN

## 2018-04-01 MED ORDER — AZITHROMYCIN 250 MG PO TABS: ORAL_TABLET | ORAL | 0 refills | Status: DC

## 2018-04-01 MED ORDER — ALBUTEROL SULFATE HFA 108 (90 BASE) MCG/ACT IN AERS: 2.0000 | INHALATION_SPRAY | Freq: Four times a day (QID) | RESPIRATORY_TRACT | 0 refills | Status: DC | PRN

## 2018-04-01 NOTE — Patient Instructions (Signed)
Upper Respiratory Infection, Adult Most upper respiratory infections (URIs) are caused by a virus. A URI affects the nose, throat, and upper air passages. The most common type of URI is often called "the common cold." Follow these instructions at home:  Take medicines only as told by your doctor.  Gargle warm saltwater or take cough drops to comfort your throat as told by your doctor.  Use a warm mist humidifier or inhale steam from a shower to increase air moisture. This may make it easier to breathe.  Drink enough fluid to keep your pee (urine) clear or pale yellow.  Eat soups and other clear broths.  Have a healthy diet.  Rest as needed.  Go back to work when your fever is gone or your doctor says it is okay. ? You may need to stay home longer to avoid giving your URI to others. ? You can also wear a face mask and wash your hands often to prevent spread of the virus.  Use your inhaler more if you have asthma.  Do not use any tobacco products, including cigarettes, chewing tobacco, or electronic cigarettes. If you need help quitting, ask your doctor. Contact a doctor if:  You are getting worse, not better.  Your symptoms are not helped by medicine.  You have chills.  You are getting more short of breath.  You have brown or red mucus.  You have yellow or brown discharge from your nose.  You have pain in your face, especially when you bend forward.  You have a fever.  You have puffy (swollen) neck glands.  You have pain while swallowing.  You have white areas in the back of your throat. Get help right away if:  You have very bad or constant: ? Headache. ? Ear pain. ? Pain in your forehead, behind your eyes, and over your cheekbones (sinus pain). ? Chest pain.  You have long-lasting (chronic) lung disease and any of the following: ? Wheezing. ? Long-lasting cough. ? Coughing up blood. ? A change in your usual mucus.  You have a stiff neck.  You have  changes in your: ? Vision. ? Hearing. ? Thinking. ? Mood. This information is not intended to replace advice given to you by your health care provider. Make sure you discuss any questions you have with your health care provider. Document Released: 12/02/2007 Document Revised: 02/16/2016 Document Reviewed: 09/20/2013 Elsevier Interactive Patient Education  2018 Elsevier Inc.  

## 2018-04-01 NOTE — Progress Notes (Signed)
[facility] [address] [place], [place] [address]  Internal MEDICINE  Office Visit Note  Patient Name: John Randall  [id number]  [id number]  Date of Service: 04/04/2018  Chief Complaint  Patient presents with  . Sinusitis  . Cough     HPI Pt is here for a sick visit. Pt reports one week ago he started having sinus pain/ pressure.  He left work, and made an appt, however he slept through the appt.  He took the week off work for vacation and was hoping to feel better.  He reports feeling feverish, with productive cough. He reports he is sleeping fine at night.  He has been using otc mucinex, and ibuprofen.  He reports the coughing is giving him a headache at time.     Current Medication:  Outpatient Encounter Medications as of 04/01/2018  Medication Sig  . albuterol (PROVENTIL HFA;VENTOLIN HFA) 108 (90 Base) MCG/ACT inhaler Inhale 2 puffs into the lungs every 6 (six) hours as needed for wheezing or shortness of breath.  Marland Kitchen azithromycin (ZITHROMAX) 250 MG tablet Take 2 tabs on day one, and One tab on days 2-5.  . benzonatate (TESSALON) 200 MG capsule Take 1 capsule (200 mg total) by mouth 2 (two) times daily as needed for cough.  Marland Kitchen guaiFENesin-codeine (ROBITUSSIN AC) 100-10 MG/5ML syrup Take 5 mLs by mouth 3 (three) times daily as needed for cough.  . [DISCONTINUED] oxyCODONE-acetaminophen (ROXICET) 5-325 MG tablet Take 1 tablet by mouth every 4 (four) hours as needed for severe pain. (Patient not taking: Reported on 07/13/2017)   No facility-administered encounter medications on file as of 04/01/2018.       Medical History: Past Medical History:  Diagnosis Date  . Acid reflux   . L4-5 disc bulge 06/27/2015  . Sleep apnea      Vital Signs: Pulse 69   Temp (!) 97.4 F (36.3 C)   Resp 16   Ht 6' (1.829 m)   Wt 220 lb (99.8 kg)   SpO2 99%   BMI 29.84 kg/m    Review of Systems  Constitutional: Positive for appetite change, chills, fatigue and  unexpected weight change.  HENT: Positive for congestion, rhinorrhea and sinus pressure. Negative for sneezing.   Eyes: Negative for redness.  Respiratory: Positive for cough. Negative for chest tightness and shortness of breath.   Cardiovascular: Negative.  Negative for chest pain and palpitations.  Gastrointestinal: Negative.  Negative for abdominal pain, constipation, diarrhea, nausea and vomiting.  Endocrine: Negative.   Genitourinary: Negative.  Negative for dysuria and frequency.  Musculoskeletal: Negative.  Negative for arthralgias, back pain, joint swelling and neck pain.  Skin: Negative.  Negative for rash.  Allergic/Immunologic: Negative.   Neurological: Negative.  Negative for tremors and numbness.  Hematological: Negative for adenopathy. Does not bruise/bleed easily.  Psychiatric/Behavioral: Negative.  Negative for behavioral problems, sleep disturbance and suicidal ideas. The patient is not nervous/anxious.     Physical Exam  Constitutional: He is oriented to person, place, and time. He appears well-developed and well-nourished. No distress.  HENT:  Head: Normocephalic and atraumatic.  Mouth/Throat: Oropharynx is clear and moist. No oropharyngeal exudate.  Eyes: Pupils are equal, round, and reactive to light. EOM are normal.  Neck: Normal range of motion. Neck supple. No JVD present. No tracheal deviation present. No thyromegaly present.  Cardiovascular: Normal rate, regular rhythm and normal heart sounds. Exam reveals no gallop and no friction rub.  No murmur heard. Pulmonary/Chest: Effort normal. No respiratory distress.  He has wheezes. He has no rales. He exhibits no tenderness.  Abdominal: Soft. There is no tenderness. There is no guarding.  Musculoskeletal: Normal range of motion.  Lymphadenopathy:    He has no cervical adenopathy.  Neurological: He is alert and oriented to person, place, and time. No cranial nerve deficit.  Skin: Skin is warm and dry. He is not  diaphoretic.  Psychiatric: He has a normal mood and affect. His behavior is normal. Judgment and thought content normal.  Nursing note and vitals reviewed.   Assessment/Plan: 1. Upper respiratory tract infection, unspecified type Take zpak as directed.  Return to clinic if not feeling better in 4 days.  - azithromycin (ZITHROMAX) 250 MG tablet; Take 2 tabs on day one, and One tab on days 2-5.  Dispense: 6 tablet; Refill: 0  2. Cough Use Tessalon during the day, and Robitussin AC at night as needed for cough. - benzonatate (TESSALON) 200 MG capsule; Take 1 capsule (200 mg total) by mouth 2 (two) times daily as needed for cough.  Dispense: 20 capsule; Refill: 0 - guaiFENesin-codeine (ROBITUSSIN AC) 100-10 MG/5ML syrup; Take 5 mLs by mouth 3 (three) times daily as needed for cough.  Dispense: 120 mL; Refill: 0  3. Wheeze Faint wheeze noted with expiration, Pt denies SOB.  Use Albuterol as prescribed. - albuterol (PROVENTIL HFA;VENTOLIN HFA) 108 (90 Base) MCG/ACT inhaler; Inhale 2 puffs into the lungs every 6 (six) hours as needed for wheezing or shortness of breath.  Dispense: 1 Inhaler; Refill: 0  General Counseling: John Randall understanding of the findings of todays visit and agrees with plan of treatment. I have discussed any further diagnostic evaluation that may be needed or ordered today. We also reviewed his medications today. he has been encouraged to call the office with any questions or concerns that should arise related to todays visit.   No orders of the defined types were placed in this encounter.   Meds ordered this encounter  Medications  . azithromycin (ZITHROMAX) 250 MG tablet    Sig: Take 2 tabs on day one, and One tab on days 2-5.    Dispense:  6 tablet    Refill:  0  . benzonatate (TESSALON) 200 MG capsule    Sig: Take 1 capsule (200 mg total) by mouth 2 (two) times daily as needed for cough.    Dispense:  20 capsule    Refill:  0  . guaiFENesin-codeine  (ROBITUSSIN AC) 100-10 MG/5ML syrup    Sig: Take 5 mLs by mouth 3 (three) times daily as needed for cough.    Dispense:  120 mL    Refill:  0  . albuterol (PROVENTIL HFA;VENTOLIN HFA) 108 (90 Base) MCG/ACT inhaler    Sig: Inhale 2 puffs into the lungs every 6 (six) hours as needed for wheezing or shortness of breath.    Dispense:  1 Inhaler    Refill:  0    Time spent: 25 Minutes  This patient was seen by Blima Ledger AGNP-C in Collaboration with Dr Lyndon Code as a part of collaborative care agreement

## 2018-06-13 DIAGNOSIS — S4991XA Unspecified injury of right shoulder and upper arm, initial encounter: Secondary | ICD-10-CM | POA: Diagnosis not present

## 2018-06-13 DIAGNOSIS — R0781 Pleurodynia: Secondary | ICD-10-CM | POA: Diagnosis not present

## 2018-06-13 DIAGNOSIS — S40011A Contusion of right shoulder, initial encounter: Secondary | ICD-10-CM | POA: Diagnosis not present

## 2018-08-05 ENCOUNTER — Other Ambulatory Visit: Payer: Self-pay | Admitting: Adult Health

## 2018-08-05 MED ORDER — VALACYCLOVIR HCL 1 G PO TABS: 1000.0000 mg | ORAL_TABLET | Freq: Two times a day (BID) | ORAL | 0 refills | Status: DC

## 2018-08-05 NOTE — Telephone Encounter (Signed)
Patient was advised that he needs to schedule appointment for follow up

## 2019-02-08 ENCOUNTER — Encounter: Payer: Self-pay | Admitting: Adult Health

## 2019-02-08 ENCOUNTER — Ambulatory Visit: Payer: BLUE CROSS/BLUE SHIELD | Admitting: Adult Health

## 2019-02-08 ENCOUNTER — Other Ambulatory Visit: Payer: Self-pay

## 2019-02-08 DIAGNOSIS — R05 Cough: Secondary | ICD-10-CM | POA: Diagnosis not present

## 2019-02-08 DIAGNOSIS — R0989 Other specified symptoms and signs involving the circulatory and respiratory systems: Secondary | ICD-10-CM

## 2019-02-08 DIAGNOSIS — R059 Cough, unspecified: Secondary | ICD-10-CM

## 2019-02-08 DIAGNOSIS — K219 Gastro-esophageal reflux disease without esophagitis: Secondary | ICD-10-CM | POA: Diagnosis not present

## 2019-02-08 MED ORDER — PANTOPRAZOLE SODIUM 40 MG PO TBEC: 40.0000 mg | DELAYED_RELEASE_TABLET | Freq: Every day | ORAL | 2 refills | Status: DC

## 2019-02-08 NOTE — Progress Notes (Signed)
[facility] [address] [place], [place] [id number]  Internal MEDICINE  Telephone Visit  Patient Name: John BirchwoodJoseph Blake Randall  [id number]  [id number]  Date of Service: 02/08/2019  I connected with the patient at 1500 by telephone and verified the patients identity using two identifiers.   I discussed the limitations, risks, security and privacy concerns of performing an evaluation and management service by telephone and the availability of in person appointments. I also discussed with the patient that there may be a patient responsible charge related to the service.  The patient expressed understanding and agrees to proceed.    Chief Complaint  Patient presents with  . Telephone Screen  . Gastroesophageal Reflux    when laying down start coughing , choking in sleep worse   . Telephone Assessment    HPI  Pt reports when he is sleeping at night, he starts choking. In the past he was diagnosed with sleep apnea and could not tolerate the cpap machine.  He also reports a history of GERD for which he has not taken medicine for in awhile.    Current Medication: Outpatient Encounter Medications as of 02/08/2019  Medication Sig  . albuterol (PROVENTIL HFA;VENTOLIN HFA) 108 (90 Base) MCG/ACT inhaler Inhale 2 puffs into the lungs every 6 (six) hours as needed for wheezing or shortness of breath.  . [DISCONTINUED] azithromycin (ZITHROMAX) 250 MG tablet Take 2 tabs on day one, and One tab on days 2-5. (Patient not taking: Reported on 02/08/2019)  . [DISCONTINUED] benzonatate (TESSALON) 200 MG capsule Take 1 capsule (200 mg total) by mouth 2 (two) times daily as needed for cough. (Patient not taking: Reported on 02/08/2019)  . [DISCONTINUED] guaiFENesin-codeine (ROBITUSSIN AC) 100-10 MG/5ML syrup Take 5 mLs by mouth 3 (three) times daily as needed for cough. (Patient not taking: Reported on 02/08/2019)  . [DISCONTINUED] valACYclovir (VALTREX) 1000 MG tablet Take 1 tablet (1,000 mg total) by  mouth 2 (two) times daily. (Patient not taking: Reported on 02/08/2019)   No facility-administered encounter medications on file as of 02/08/2019.     Surgical History: Past Surgical History:  Procedure Laterality Date  . EYE SURGERY      Medical History: Past Medical History:  Diagnosis Date  . Acid reflux   . L4-5 disc bulge 06/27/2015  . Sleep apnea     Family History: Family History  Problem Relation Age of Onset  . Heart disease Father     Social History   Socioeconomic History  . Marital status: Married    Spouse name: Not on file  . Number of children: Not on file  . Years of education: Not on file  . Highest education level: Not on file  Occupational History  . Not on file  Social Needs  . Financial resource strain: Not on file  . Food insecurity    Worry: Not on file    Inability: Not on file  . Transportation needs    Medical: Not on file    Non-medical: Not on file  Tobacco Use  . Smoking status: Former Games developermoker  . Smokeless tobacco: Never Used  Substance and Sexual Activity  . Alcohol use: No    Alcohol/week: 0.0 standard drinks  . Drug use: Yes    Types: Marijuana  . Sexual activity: Not on file  Lifestyle  . Physical activity    Days per week: Not on file    Minutes per session: Not on file  . Stress: Not on file  Relationships  .  Social Herbalist on phone: Not on file    Gets together: Not on file    Attends religious service: Not on file    Active member of club or organization: Not on file    Attends meetings of clubs or organizations: Not on file    Relationship status: Not on file  . Intimate partner violence    Fear of current or ex partner: Not on file    Emotionally abused: Not on file    Physically abused: Not on file    Forced sexual activity: Not on file  Other Topics Concern  . Not on file  Social History Narrative  . Not on file      Review of Systems  Constitutional: Negative.  Negative for chills,  fatigue and unexpected weight change.  HENT: Negative.  Negative for congestion, rhinorrhea, sneezing and sore throat.   Eyes: Negative for redness.  Respiratory: Positive for cough and choking. Negative for chest tightness and shortness of breath.   Cardiovascular: Negative.  Negative for chest pain and palpitations.  Gastrointestinal: Negative.  Negative for abdominal pain, constipation, diarrhea, nausea and vomiting.  Endocrine: Negative.   Genitourinary: Negative.  Negative for dysuria and frequency.  Musculoskeletal: Negative.  Negative for arthralgias, back pain, joint swelling and neck pain.  Skin: Negative.  Negative for rash.  Allergic/Immunologic: Negative.   Neurological: Negative.  Negative for tremors and numbness.  Hematological: Negative for adenopathy. Does not bruise/bleed easily.  Psychiatric/Behavioral: Negative.  Negative for behavioral problems, sleep disturbance and suicidal ideas. The patient is not nervous/anxious.     Vital Signs: There were no vitals taken for this visit.   Observation/Objective:  Well appearing nad noted.    Assessment/Plan: 1. Gastroesophageal reflux disease without esophagitis Take protonix daily, and follow up in 6 weeks.  - pantoprazole (PROTONIX) 40 MG tablet; Take 1 tablet (40 mg total) by mouth daily.  Dispense: 30 tablet; Refill: 2  2. Choking episode occurring at night Discussed sleep apnea, and new sleep study.  Pt is not sure he would be able to tolerate a cpap mask.  He would like ot wait and see if the gerd treatment will improve his symptoms.    3. Cough Nocturnal cough that wakes him up.  Likely sleep apnea, see above.   General Counseling: jaxden blyden understanding of the findings of today's phone visit and agrees with plan of treatment. I have discussed any further diagnostic evaluation that may be needed or ordered today. We also reviewed his medications today. he has been encouraged to call the office with any  questions or concerns that should arise related to todays visit.    No orders of the defined types were placed in this encounter.   No orders of the defined types were placed in this encounter.   Time spent: [place] AGNP-C Internal medicine

## 2019-02-15 ENCOUNTER — Encounter: Payer: Self-pay | Admitting: Adult Health

## 2019-02-27 ENCOUNTER — Emergency Department
Admission: EM | Admit: 2019-02-27 | Discharge: 2019-02-27 | Disposition: A | Discharge status: Home / Self Care | Payer: BLUE CROSS/BLUE SHIELD | Attending: Emergency Medicine | Admitting: Emergency Medicine

## 2019-02-27 ENCOUNTER — Other Ambulatory Visit: Payer: Self-pay

## 2019-02-27 ENCOUNTER — Emergency Department: Payer: BLUE CROSS/BLUE SHIELD

## 2019-02-27 DIAGNOSIS — S6991XA Unspecified injury of right wrist, hand and finger(s), initial encounter: Secondary | ICD-10-CM | POA: Diagnosis not present

## 2019-02-27 DIAGNOSIS — S20211A Contusion of right front wall of thorax, initial encounter: Secondary | ICD-10-CM | POA: Diagnosis not present

## 2019-02-27 DIAGNOSIS — Y93I9 Activity, other involving external motion: Secondary | ICD-10-CM | POA: Insufficient documentation

## 2019-02-27 DIAGNOSIS — S60221A Contusion of right hand, initial encounter: Secondary | ICD-10-CM | POA: Insufficient documentation

## 2019-02-27 DIAGNOSIS — Z87891 Personal history of nicotine dependence: Secondary | ICD-10-CM | POA: Diagnosis not present

## 2019-02-27 DIAGNOSIS — R0781 Pleurodynia: Secondary | ICD-10-CM | POA: Diagnosis not present

## 2019-02-27 DIAGNOSIS — Y929 Unspecified place or not applicable: Secondary | ICD-10-CM | POA: Insufficient documentation

## 2019-02-27 DIAGNOSIS — S299XXA Unspecified injury of thorax, initial encounter: Secondary | ICD-10-CM | POA: Diagnosis not present

## 2019-02-27 DIAGNOSIS — M79641 Pain in right hand: Secondary | ICD-10-CM | POA: Diagnosis not present

## 2019-02-27 DIAGNOSIS — Y999 Unspecified external cause status: Secondary | ICD-10-CM | POA: Insufficient documentation

## 2019-02-27 LAB — COMPREHENSIVE METABOLIC PANEL
ALT: 35 U/L (ref 0–44)
AST: 37 U/L (ref 15–41)
Albumin: 4.3 g/dL (ref 3.5–5.0)
Alkaline Phosphatase: 71 U/L (ref 38–126)
Anion gap: 9 (ref 5–15)
BUN: 17 mg/dL (ref 6–20)
CO2: 22 mmol/L (ref 22–32)
Calcium: 9.4 mg/dL (ref 8.9–10.3)
Chloride: 108 mmol/L (ref 98–111)
Creatinine, Ser: 0.9 mg/dL (ref 0.61–1.24)
GFR calc Af Amer: >60 mL/min (ref >60)
GFR calc non Af Amer: >60 mL/min (ref >60)
Glucose, Bld: 104 mg/dL — ABNORMAL HIGH (ref 70–99)
Potassium: 3.9 mmol/L (ref 3.5–5.1)
Sodium: 139 mmol/L (ref 135–145)
Total Bilirubin: 0.7 mg/dL (ref 0.3–1.2)
Total Protein: 7.6 g/dL (ref 6.5–8.1)

## 2019-02-27 LAB — CBC
HCT: 46.8 % (ref 39.0–52.0)
Hemoglobin: 15.5 g/dL (ref 13.0–17.0)
MCH: 29.4 pg (ref 26.0–34.0)
MCHC: 33.1 g/dL (ref 30.0–36.0)
MCV: 88.6 fL (ref 80.0–100.0)
Platelets: 215 10*3/uL (ref 150–400)
RBC: 5.28 MIL/uL (ref 4.22–5.81)
RDW: 12.8 % (ref 11.5–15.5)
WBC: 9.4 10*3/uL (ref 4.0–10.5)
nRBC: 0 % (ref 0.0–0.2)

## 2019-02-27 MED ORDER — KETOROLAC TROMETHAMINE 30 MG/ML IJ SOLN
30.0000 mg | Freq: Once | INTRAMUSCULAR | Status: AC
  Administered 2019-02-27: 30 mg via INTRAVENOUS
  Filled 2019-02-27: qty 1

## 2019-02-27 MED ORDER — NAPROXEN 500 MG PO TABS: 500.0000 mg | ORAL_TABLET | Freq: Two times a day (BID) | ORAL | 2 refills | Status: DC

## 2019-02-27 NOTE — ED Notes (Signed)
No resp distress. Splint to R wrist, states this decreases the discomfort in wrist.
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
## 2019-03-23 ENCOUNTER — Ambulatory Visit: Payer: BLUE CROSS/BLUE SHIELD | Admitting: Adult Health

## 2019-05-02 ENCOUNTER — Other Ambulatory Visit: Payer: Self-pay | Admitting: Adult Health

## 2019-05-02 DIAGNOSIS — K219 Gastro-esophageal reflux disease without esophagitis: Secondary | ICD-10-CM
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
## 2022-06-29 DIAGNOSIS — K5792 Diverticulitis of intestine, part unspecified, without perforation or abscess without bleeding: Secondary | ICD-10-CM

## 2022-06-29 HISTORY — DX: Diverticulitis of intestine, part unspecified, without perforation or abscess without bleeding: K57.92

## 2023-04-14 ENCOUNTER — Other Ambulatory Visit: Payer: Self-pay

## 2023-04-14 ENCOUNTER — Emergency Department: Payer: BC Managed Care – PPO

## 2023-04-14 ENCOUNTER — Inpatient Hospital Stay
Admission: EM | Admit: 2023-04-14 | Discharge: 2023-04-16 | DRG: 392 | Disposition: A | Discharge status: Home / Self Care | Payer: BC Managed Care – PPO | Attending: Surgery | Admitting: Surgery

## 2023-04-14 ENCOUNTER — Ambulatory Visit
Admission: EM | Admit: 2023-04-14 | Discharge: 2023-04-14 | Payer: BC Managed Care – PPO | Attending: Family Medicine | Admitting: Family Medicine

## 2023-04-14 DIAGNOSIS — Z8249 Family history of ischemic heart disease and other diseases of the circulatory system: Secondary | ICD-10-CM

## 2023-04-14 DIAGNOSIS — Z87891 Personal history of nicotine dependence: Secondary | ICD-10-CM | POA: Diagnosis not present

## 2023-04-14 DIAGNOSIS — R1031 Right lower quadrant pain: Secondary | ICD-10-CM | POA: Diagnosis not present

## 2023-04-14 DIAGNOSIS — K5720 Diverticulitis of large intestine with perforation and abscess without bleeding: Principal | ICD-10-CM | POA: Diagnosis present

## 2023-04-14 DIAGNOSIS — Z79899 Other long term (current) drug therapy: Secondary | ICD-10-CM

## 2023-04-14 DIAGNOSIS — K219 Gastro-esophageal reflux disease without esophagitis: Secondary | ICD-10-CM | POA: Diagnosis present

## 2023-04-14 DIAGNOSIS — E876 Hypokalemia: Secondary | ICD-10-CM | POA: Diagnosis present

## 2023-04-14 LAB — LIPASE, BLOOD: Lipase: 25 U/L (ref 11–51)

## 2023-04-14 LAB — COMPREHENSIVE METABOLIC PANEL
ALT: 26 U/L (ref 0–44)
AST: 23 U/L (ref 15–41)
Albumin: 4.3 g/dL (ref 3.5–5.0)
Alkaline Phosphatase: 67 U/L (ref 38–126)
Anion gap: 9 (ref 5–15)
BUN: 13 mg/dL (ref 6–20)
CO2: 25 mmol/L (ref 22–32)
Calcium: 9 mg/dL (ref 8.9–10.3)
Chloride: 100 mmol/L (ref 98–111)
Creatinine, Ser: 0.93 mg/dL (ref 0.61–1.24)
GFR, Estimated: >60 mL/min (ref >60)
Glucose, Bld: 101 mg/dL — ABNORMAL HIGH (ref 70–99)
Potassium: 3.4 mmol/L — ABNORMAL LOW (ref 3.5–5.1)
Sodium: 134 mmol/L — ABNORMAL LOW (ref 135–145)
Total Bilirubin: 1.6 mg/dL — ABNORMAL HIGH (ref 0.3–1.2)
Total Protein: 8 g/dL (ref 6.5–8.1)

## 2023-04-14 LAB — URINALYSIS, ROUTINE W REFLEX MICROSCOPIC
Bilirubin Urine: NEGATIVE
Glucose, UA: NEGATIVE mg/dL
Hgb urine dipstick: NEGATIVE
Ketones, ur: NEGATIVE mg/dL
Leukocytes,Ua: NEGATIVE
Nitrite: NEGATIVE
Protein, ur: NEGATIVE mg/dL
Specific Gravity, Urine: 1.027 (ref 1.005–1.030)
pH: 5 (ref 5.0–8.0)

## 2023-04-14 LAB — CBC
HCT: 45.8 % (ref 39.0–52.0)
Hemoglobin: 15 g/dL (ref 13.0–17.0)
MCH: 29.5 pg (ref 26.0–34.0)
MCHC: 32.8 g/dL (ref 30.0–36.0)
MCV: 90.2 fL (ref 80.0–100.0)
Platelets: 201 10*3/uL (ref 150–400)
RBC: 5.08 MIL/uL (ref 4.22–5.81)
RDW: 12.8 % (ref 11.5–15.5)
WBC: 13 10*3/uL — ABNORMAL HIGH (ref 4.0–10.5)
nRBC: 0 % (ref 0.0–0.2)

## 2023-04-14 MED ORDER — KETOROLAC TROMETHAMINE 30 MG/ML IJ SOLN
30.0000 mg | Freq: Four times a day (QID) | INTRAMUSCULAR | Status: DC
  Administered 2023-04-14 (×2): 30 mg via INTRAVENOUS
  Filled 2023-04-14 (×4): qty 1

## 2023-04-14 MED ORDER — ALBUTEROL SULFATE (2.5 MG/3ML) 0.083% IN NEBU: 3.0000 mL | INHALATION_SOLUTION | Freq: Four times a day (QID) | RESPIRATORY_TRACT | Status: DC | PRN

## 2023-04-14 MED ORDER — PIPERACILLIN-TAZOBACTAM 3.375 G IVPB 30 MIN
3.3750 g | Freq: Once | INTRAVENOUS | Status: AC
  Administered 2023-04-14: 3.375 g via INTRAVENOUS
  Filled 2023-04-14 (×2): qty 50

## 2023-04-14 MED ORDER — ACETAMINOPHEN 500 MG PO TABS
1000.0000 mg | ORAL_TABLET | Freq: Four times a day (QID) | ORAL | Status: DC
  Administered 2023-04-14 (×2): 1000 mg via ORAL
  Filled 2023-04-14 (×4): qty 2

## 2023-04-14 MED ORDER — POLYETHYLENE GLYCOL 3350 17 G PO PACK: 17.0000 g | PACK | Freq: Every day | ORAL | Status: DC | PRN

## 2023-04-14 MED ORDER — PANTOPRAZOLE SODIUM 40 MG IV SOLR
40.0000 mg | Freq: Every day | INTRAVENOUS | Status: DC
  Administered 2023-04-14 – 2023-04-15 (×2): 40 mg via INTRAVENOUS
  Filled 2023-04-14 (×2): qty 10

## 2023-04-14 MED ORDER — ORAL CARE MOUTH RINSE: 15.0000 mL | OROMUCOSAL | Status: DC | PRN

## 2023-04-14 MED ORDER — PIPERACILLIN-TAZOBACTAM 3.375 G IVPB
3.3750 g | Freq: Three times a day (TID) | INTRAVENOUS | Status: DC
  Administered 2023-04-15 – 2023-04-16 (×4): 3.375 g via INTRAVENOUS
  Filled 2023-04-14 (×4): qty 50

## 2023-04-14 MED ORDER — ONDANSETRON 4 MG PO TBDP: 4.0000 mg | ORAL_TABLET | Freq: Four times a day (QID) | ORAL | Status: DC | PRN

## 2023-04-14 MED ORDER — ENOXAPARIN SODIUM 60 MG/0.6ML IJ SOSY
55.0000 mg | PREFILLED_SYRINGE | INTRAMUSCULAR | Status: DC
  Administered 2023-04-15: 55 mg via SUBCUTANEOUS
  Filled 2023-04-14 (×2): qty 0.6

## 2023-04-14 MED ORDER — LACTATED RINGERS IV SOLN: INTRAVENOUS | Status: DC

## 2023-04-14 MED ORDER — ONDANSETRON HCL 4 MG/2ML IJ SOLN: 4.0000 mg | Freq: Four times a day (QID) | INTRAMUSCULAR | Status: DC | PRN

## 2023-04-14 MED ORDER — IOHEXOL 300 MG/ML  SOLN
100.0000 mL | Freq: Once | INTRAMUSCULAR | Status: AC | PRN
  Administered 2023-04-14: 100 mL via INTRAVENOUS

## 2023-04-14 MED ORDER — HYDROMORPHONE HCL 1 MG/ML IJ SOLN: 0.5000 mg | INTRAMUSCULAR | Status: DC | PRN

## 2023-04-14 NOTE — ED Provider Notes (Signed)
[facility] Provider Note    Event Date/Time   First MD Initiated Contact with Patient 04/14/23 1545     (approximate)   History   Abdominal Pain (X 2 days)   HPI  John Randall is a 42 y.o. male sent from urgent care for evaluation of right lower quadrant pain.  Patient reports yesterday he had diffuse abdominal pain, now it seems to have localized in the right lower quadrant.  No history of abdominal surgery     Physical Exam   Triage Vital Signs: ED Triage Vitals  Encounter Vitals Group     BP 04/14/23 1515 131/87     Systolic BP Percentile --      Diastolic BP Percentile --      Pulse Rate 04/14/23 1515 70     Resp 04/14/23 1515 14     Temp 04/14/23 1515 98.8 F (37.1 C)     Temp Source 04/14/23 1515 Oral     SpO2 04/14/23 1515 100 %     Weight 04/14/23 1512 111.1 kg (245 lb)     Height 04/14/23 1512 1.829 m (6')     Head Circumference --      Peak Flow --      Pain Score 04/14/23 1512 3     Pain Loc --      Pain Education --      Exclude from Growth Chart --     Most recent vital signs: Vitals:   04/14/23 1515  BP: 131/87  Pulse: 70  Resp: 14  Temp: 98.8 F (37.1 C)  SpO2: 100%     General: Awake, no distress.  CV:  Good peripheral perfusion.  Resp:  Normal effort.  Abd:  No distention.  Right lower quadrant tenderness to palpation Other:     ED Results / Procedures / Treatments   Labs (all labs ordered are listed, but only abnormal results are displayed) Labs Reviewed  COMPREHENSIVE METABOLIC PANEL - Abnormal; Notable for the following components:      Result Value   Sodium 134 (*)    Potassium 3.4 (*)    Glucose, Bld 101 (*)    Total Bilirubin 1.6 (*)    All other components within normal limits  CBC - Abnormal; Notable for the following components:   WBC 13.0 (*)    All other components within normal limits  URINALYSIS, ROUTINE W REFLEX MICROSCOPIC - Abnormal; Notable for the following components:    Color, Urine YELLOW (*)    APPearance CLEAR (*)    All other components within normal limits  LIPASE, BLOOD     EKG     RADIOLOGY Contacted by radiologist and notified of cecal diverticulitis with perforation    PROCEDURES:  Critical Care performed: yes  CRITICAL CARE Performed by: Jene Every   Total critical care time: 30 minutes  Critical care time was exclusive of separately billable procedures and treating other patients.  Critical care was necessary to treat or prevent imminent or life-threatening deterioration.  Critical care was time spent personally by me on the following activities: development of treatment plan with patient and/or surrogate as well as nursing, discussions with consultants, evaluation of patient's response to treatment, examination of patient, obtaining history from patient or surrogate, ordering and performing treatments and interventions, ordering and review of laboratory studies, ordering and review of radiographic studies, pulse oximetry and re-evaluation of patient's condition.   Procedures   MEDICATIONS ORDERED IN ED: Medications  piperacillin-tazobactam (  ZOSYN) IVPB 3.375 g (has no administration in time range)  iohexol (OMNIPAQUE) 300 MG/ML solution 100 mL (100 mLs Intravenous Contrast Given 04/14/23 1636)     IMPRESSION / MDM / ASSESSMENT AND PLAN / ED COURSE  I reviewed the triage vital signs and the nursing notes. Patient's presentation is most consistent with acute presentation with potential threat to life or bodily function.  Patient presents with right lower quadrant abdominal pain, highly suspicious for appendicitis, differential also includes colitis versus diverticulitis  Offered analgesics however the patient has declined, pending CT abdomen pelvis with IV contrast.  Lab work reviewed demonstrates mild elevation of white blood cell count.  Radiologist contacted me informing me of cecal diverticulitis and perforation,  discussed with Dr. Aleen Campi of general surgery who recommends IV antibiotics, n.p.o. status      FINAL CLINICAL IMPRESSION(S) / ED DIAGNOSES   Final diagnoses:  Diverticulitis of colon with perforation     Rx / DC Orders   ED Discharge Orders     None        Note:  This document was prepared using Dragon voice recognition software and may include unintentional dictation errors.   Jene Every, MD 04/14/23 [phone]

## 2023-04-14 NOTE — H&P (Signed)
Date of Admission:  04/14/2023  Reason for Admission:  Acute cecal diverticulitis with perforation  History of Present Illness: John Randall is a 42 y.o. male presenting with 2 days of worsening abdominal pain.  The patient and his wife report that he's had on and off abdominal pain, mostly in the right lower quadrant for the past few months, but the patient had not sought any medical attention for it.  Sometimes he will skip work because of the pain.  The pain does not last long and resolves on its own.  However, yesterday his pain was more significant compared to prior episodes.  The pain started in the mid abdomen and then radiated to the right lower quadrant.  Denies any fevers or chills, chest pain, shortness of breath, nausea, vomiting.  In the ED, his workup showed a WBC of 13 and a CT scan showed perforated cecal diverticulitis with small foci of air but no abscess.  His appendix was within normal.  Past Medical History: Past Medical History:  Diagnosis Date   Acid reflux    L4-5 disc bulge 06/27/2015   Sleep apnea      Past Surgical History: Past Surgical History:  Procedure Laterality Date   EYE SURGERY      Home Medications: Prior to Admission medications   Medication Sig Start Date End Date Taking? Authorizing Provider  albuterol (PROVENTIL HFA;VENTOLIN HFA) 108 (90 Base) MCG/ACT inhaler Inhale 2 puffs into the lungs every 6 (six) hours as needed for wheezing or shortness of breath. 04/01/18   Johnna Acosta, NP  naproxen (NAPROSYN) 500 MG tablet Take 1 tablet (500 mg total) by mouth 2 (two) times daily with a meal. 02/27/19   Jene Every, MD  pantoprazole (PROTONIX) 40 MG tablet Take 1 tablet (40 mg total) by mouth daily. 02/08/19   Johnna Acosta, NP    Allergies: No Known Allergies  Social History:  reports that he has quit smoking. He has never used smokeless tobacco. He reports current drug use. Drug: Marijuana. He reports that he does not drink alcohol.    Family History: Family History  Problem Relation Age of Onset   Heart disease Father     Review of Systems: Review of Systems  Constitutional:  Negative for chills and fever.  HENT:  Negative for hearing loss.   Respiratory:  Negative for shortness of breath.   Cardiovascular:  Negative for chest pain.  Gastrointestinal:  Positive for abdominal pain. Negative for nausea and vomiting.  Genitourinary:  Negative for dysuria.  Musculoskeletal:  Negative for myalgias.  Skin:  Negative for rash.  Neurological:  Negative for dizziness.  Psychiatric/Behavioral:  Negative for depression.     Physical Exam BP 131/87 (BP Location: Right Arm)   Pulse 70   Temp 98.8 F (37.1 C) (Oral)   Resp 14   Ht 6' (1.829 m)   Wt 111.1 kg   SpO2 100%   BMI 33.23 kg/m  CONSTITUTIONAL: No acute distress HEENT:  Normocephalic, atraumatic, extraocular motion intact. NECK: Trachea is midline, and there is no jugular venous distension.  RESPIRATORY:  Normal respiratory effort without pathologic use of accessory muscles. CARDIOVASCULAR: Regular rhythm and rate. GI: The abdomen is soft, non-distended, with localized tenderness to palpation in the right lower quadrant.  No diffuse peritonitis.  MUSCULOSKELETAL:  Normal muscle strength and tone in all four extremities.  No peripheral edema or cyanosis. SKIN: Skin turgor is normal. There are no pathologic skin lesions.  NEUROLOGIC:  Motor  and sensation is grossly normal.  Cranial nerves are grossly intact. PSYCH:  Alert and oriented to person, place and time. Affect is normal.  Laboratory Analysis: Results for orders placed or performed during the hospital encounter of 04/14/23 (from the past 24 hour(s))  Urinalysis, Routine w reflex microscopic -Urine, Clean Catch     Status: Abnormal   Collection Time: 04/14/23  3:14 PM  Result Value Ref Range   Color, Urine YELLOW (A) YELLOW   APPearance CLEAR (A) CLEAR   Specific Gravity, Urine 1.027 1.005 -  1.030   pH 5.0 5.0 - 8.0   Glucose, UA NEGATIVE NEGATIVE mg/dL   Hgb urine dipstick NEGATIVE NEGATIVE   Bilirubin Urine NEGATIVE NEGATIVE   Ketones, ur NEGATIVE NEGATIVE mg/dL   Protein, ur NEGATIVE NEGATIVE mg/dL   Nitrite NEGATIVE NEGATIVE   Leukocytes,Ua NEGATIVE NEGATIVE  Lipase, blood     Status: None   Collection Time: 04/14/23  3:15 PM  Result Value Ref Range   Lipase 25 11 - 51 U/L  Comprehensive metabolic panel     Status: Abnormal   Collection Time: 04/14/23  3:15 PM  Result Value Ref Range   Sodium 134 (L) 135 - 145 mmol/L   Potassium 3.4 (L) 3.5 - 5.1 mmol/L   Chloride 100 98 - 111 mmol/L   CO2 25 22 - 32 mmol/L   Glucose, Bld 101 (H) 70 - 99 mg/dL   BUN 13 6 - 20 mg/dL   Creatinine, Ser 1.61 0.61 - 1.24 mg/dL   Calcium 9.0 8.9 - 09.6 mg/dL   Total Protein 8.0 6.5 - 8.1 g/dL   Albumin 4.3 3.5 - 5.0 g/dL   AST 23 15 - 41 U/L   ALT 26 0 - 44 U/L   Alkaline Phosphatase 67 38 - 126 U/L   Total Bilirubin 1.6 (H) 0.3 - 1.2 mg/dL   GFR, Estimated >04 >54 mL/min   Anion gap 9 5 - 15  CBC     Status: Abnormal   Collection Time: 04/14/23  3:15 PM  Result Value Ref Range   WBC 13.0 (H) 4.0 - 10.5 K/uL   RBC 5.08 4.22 - 5.81 MIL/uL   Hemoglobin 15.0 13.0 - 17.0 g/dL   HCT 09.8 11.9 - 14.7 %   MCV 90.2 80.0 - 100.0 fL   MCH 29.5 26.0 - 34.0 pg   MCHC 32.8 30.0 - 36.0 g/dL   RDW 82.9 56.2 - 13.0 %   Platelets 201 150 - 400 K/uL   nRBC 0.0 0.0 - 0.2 %    Imaging: CT ABDOMEN PELVIS W CONTRAST  Result Date: 04/14/2023 CLINICAL DATA:  Right lower quadrant abdominal pain EXAM: CT ABDOMEN AND PELVIS WITH CONTRAST TECHNIQUE: Multidetector CT imaging of the abdomen and pelvis was performed using the standard protocol following bolus administration of intravenous contrast. RADIATION DOSE REDUCTION: This exam was performed according to the departmental dose-optimization program which includes automated exposure control, adjustment of the mA and/or kV according to patient size  and/or use of iterative reconstruction technique. CONTRAST:  OMNIPAQUE IOHEXOL 300 MG/ML  SOLN COMPARISON:  None Available. FINDINGS: Lower chest: No acute abnormality. Hepatobiliary: Hepatic steatosis. Normal gallbladder. No biliary dilation. Pancreas: Unremarkable. Spleen: Unremarkable. Adrenals/Urinary Tract: Normal adrenal glands. Nonobstructing right nephrolithiasis. Otherwise no urinary calculi or hydronephrosis. Bladder is unremarkable. Stomach/Bowel: Wall thickening about the cecum and scattered cecal diverticulosis. There is pericecal stranding and trace free fluid. There is a few locules of free intraperitoneal air anterior to the cecum (  circa series 2/image 58). No abscess. No evidence of obstruction. Appendix is normal (series 2/image 60 4-73). Vascular/Lymphatic: No significant vascular findings are present. No enlarged abdominal or pelvic lymph nodes. Reproductive: Unremarkable. Other: Small fat containing umbilical hernia. Musculoskeletal: No acute fracture. IMPRESSION: 1. Cecal diverticulitis with contained perforation. No abscess. Normal appendix. 2. Nonobstructing right nephrolithiasis. 3. Hepatic steatosis. Critical Value/emergent results were called by telephone at the time of interpretation on 04/14/2023 at 5:57 pm to provider Jene Every , who verbally acknowledged these results. Electronically Signed   By: Minerva Fester M.D.   On: 04/14/2023 17:58    Assessment and Plan: This is a 42 y.o. male with acute cecal perforated diverticulitis with small foci of air.  --Discussed with the patient the findings on his CT scan and lab work.  Overall there was initial suspicion for appendicitis based on location, but discussed how the findings could have mimicked appendicitis.  Discussed what diverticulitis is and how the degree of severity can vary.  In his particular case, this is an episode of complicated diverticulitis given the perforation and foci of air.  Clinically, he feels much  better now compared to earlier today.  Discussed with him that for now, we can attempt conservative treatment plan with NPO diet, IV fluids to keep him hydrated, and IV antibiotics for his diverticulitis.  Discussed that unfortunately there is no guarantee that he wont need surgical management during this admission and we'll take it day by day to see how he progresses.  He understands. --Will repeat labs in AM and continue to assess clinically.  If improving, will be able to start him on clear liquids.   I spent 75 minutes dedicated to the care of this patient on the date of this encounter to include pre-visit review of records, face-to-face time with the patient discussing diagnosis and management, and any post-visit coordination of care.   Howie Ill, MD [facility]:  [phone]

## 2023-04-14 NOTE — ED Provider Notes (Signed)
[facility]    CSN: [id number] Arrival date & time: 04/14/23  1343      History   Chief Complaint Chief Complaint  Patient presents with   Abdominal Pain    HPI John Randall is a 42 y.o. male.   HPI  John Randall presents for RLQ abdominal pain. Had generalized abdominal pain that started a couple days ago that went away but then last night had terrible cramping.  Reports worse pain with eating. Last ate lunch yesterday which was Dione Plover.  Had one episode of loose stool. Denies fever, cough, rhinorrhea and sore throat. Had chills and thinks he may have had a fever.  Has acid reflux but this has been under control recently.    Symptoms Nausea/Vomiting: nausea Diarrhea: yes  Constipation: no  Melena/BRBPR: no  Hematemesis: no  Anorexia: no  Fever/Chills: yes  Dysuria: no  Rash: no  Wt loss: no  Sore throat: no   Cough: no Nasal congestion : no  Headache: no   Past Medical History:  Diagnosis Date   Acid reflux    L4-5 disc bulge 06/27/2015   Sleep apnea     Patient Active Problem List   Diagnosis Date Noted   Chronic lumbar radicular pain (Left) (L5 dermatome) 07/08/2015   L4-5 Disc Herniation (large right paracentral and lateral recess disc protrusion with caudal extension impinging on the descending right L5 nerve root) 07/08/2015   Lumbar bulging disks (L3-4, L4-L5, and L5-S1) 07/08/2015   Chronic pain 07/04/2015   Abnormal MRI, lumbar spine (05/31/14) 07/04/2015   Adjustment disorder with depressed mood 06/27/2015   Chronic pain of lower extremity (Location of Primary Source of Pain) (Bilateral) (L>R) 06/27/2015   Chronic radicular pain of lower extremity (Left) (L5 Dermatoma) 06/27/2015   Chronic low back pain (Midline) (L>R) 06/27/2015   Long term current use of opiate analgesic 06/27/2015   Long term prescription opiate use 06/27/2015   Opiate use 06/27/2015   Encounter for therapeutic drug level monitoring 06/27/2015   Encounter for  chronic pain management 06/27/2015   Depression 06/27/2015   Pain management 06/27/2015    Past Surgical History:  Procedure Laterality Date   EYE SURGERY         Home Medications    Prior to Admission medications   Medication Sig Start Date End Date Taking? Authorizing Provider  albuterol (PROVENTIL HFA;VENTOLIN HFA) 108 (90 Base) MCG/ACT inhaler Inhale 2 puffs into the lungs every 6 (six) hours as needed for wheezing or shortness of breath. 04/01/18   Johnna Acosta, NP  naproxen (NAPROSYN) 500 MG tablet Take 1 tablet (500 mg total) by mouth 2 (two) times daily with a meal. 02/27/19   Jene Every, MD  pantoprazole (PROTONIX) 40 MG tablet Take 1 tablet (40 mg total) by mouth daily. 02/08/19   Johnna Acosta, NP    Family History Family History  Problem Relation Age of Onset   Heart disease Father     Social History Social History   Tobacco Use   Smoking status: Former   Smokeless tobacco: Never  Advertising account planner   Vaping status: Never Used  Substance Use Topics   Alcohol use: No    Alcohol/week: 0.0 standard drinks of alcohol   Drug use: Yes    Types: Marijuana     Allergies   Patient has no known allergies.   Review of Systems Review of Systems :negative unless otherwise stated in HPI.      Physical Exam Triage  Vital Signs ED Triage Vitals  Encounter Vitals Group     BP 04/14/23 1405 128/82     Systolic BP Percentile --      Diastolic BP Percentile --      Pulse Rate 04/14/23 1405 76     Resp 04/14/23 1358 16     Temp 04/14/23 1405 98.4 F (36.9 C)     Temp Source 04/14/23 1358 Oral     SpO2 04/14/23 1405 95 %     Weight --      Height --      Head Circumference --      Peak Flow --      Pain Score 04/14/23 1404 3     Pain Loc --      Pain Education --      Exclude from Growth Chart --    No data found.  Updated Vital Signs BP 128/82 (BP Location: Right Arm)   Pulse 76   Temp 98.4 F (36.9 C) (Oral)   Resp 17   SpO2 95%   Visual  Acuity Right Eye Distance:   Left Eye Distance:   Bilateral Distance:    Right Eye Near:   Left Eye Near:    Bilateral Near:     Physical Exam  GEN: uncomfortable appearing male, in no acute distress  CV: regular rate and rhythm  RESP: no increased work of breathing, clear to ascultation bilaterally ABD: Bowel sounds present. Soft, RLQ tenderness, non-distended.  No guarding, no rebound, no appreciable hepatosplenomegaly, no CVA tenderness, positive McBurney's, negative Murphy MSK: no extremity edema SKIN: warm, dry, no rash on visible skin  UC Treatments / Results  Labs (all labs ordered are listed, but only abnormal results are displayed) Labs Reviewed - No data to display  EKG  If EKG performed, see my interpretation and MDM section  Radiology No results found.   Procedures Procedures (including critical care time)  Medications Ordered in UC Medications - No data to display  Initial Impression / Assessment and Plan / UC Course  I have reviewed the triage vital signs and the nursing notes.  Pertinent labs & imaging results that were available during my care of the patient were reviewed by me and considered in my medical decision making (see chart for details).       Patient is a  42 y.o. male with history GERD, chronic back pain,  who presents after having insidious severe RLQ abdominal pain that started generalized a few days ago.  Overall, patient is well-appearing, well-hydrated, and in no acute distress.  Vital signs stable.  [name] afebrile.  He has RLQ abdominal tenderness concerning for an acute abdomen.  Urgent Care workup truncated as pt needs a higher level of care.  I suspect he needs stat labs and a CT abdomen pelvis to evaluate for an appendicitis.  Explained to patient that I cannot rule out this here.  This may be viral enteritis but he has too much abdominal tenderness to ignore.  Recommended ED evaluation and patient is agreeable.  He is unsure  whether he will go to [facility] emergency department or [facility] emergency department.  All questions asked were answered.   Follow-up, return and ED precautions given.  Discussed MDM, treatment plan and plan for follow-up with patient who agrees with plan.    Final Clinical Impressions(s) / UC Diagnoses   Final diagnoses:  Right lower quadrant abdominal pain     Discharge Instructions  I am concerned that you may have an issue with your appendix as you are having right lower quadrant abdominal pain and tenderness.  You should be evaluated in the emergency department as you may need a CT scan that is not available here.  You have been advised to follow up immediately in the emergency department for concerning signs or symptoms as discussed during your visit. If you declined EMS transport, please have a family member take you directly to the ED at this time. Do not delay.   Based on concerns about condition, if you do not follow up in the ED, you may risk poor outcomes including worsening of condition, delayed treatment and potentially life threatening issues. If you have declined to go to the ED at this time, you should call your PCP immediately to set up a follow up appointment.      ED Prescriptions   None    PDMP not reviewed this encounter.   Katha Cabal, DO 04/14/23 1453

## 2023-04-14 NOTE — ED Notes (Signed)
Patient is being discharged from the Urgent Care and sent to the Emergency Department via POV . Per Dr.Brimage, patient is in need of higher level of care due to RLQ pain. Patient is aware and verbalizes understanding of plan of care.  Vitals:   04/14/23 1358 04/14/23 1405  BP:  128/82  Pulse:  76  Resp: 16 17  Temp:  98.4 F (36.9 C)  SpO2:  95%

## 2023-04-14 NOTE — Discharge Instructions (Signed)
I am concerned that you may have an issue with your appendix as you are having right lower quadrant abdominal pain and tenderness.  You should be evaluated in the emergency department as you may need a CT scan that is not available here.  You have been advised to follow up immediately in the emergency department for concerning signs or symptoms as discussed during your visit. If you declined EMS transport, please have a family member take you directly to the ED at this time. Do not delay.   Based on concerns about condition, if you do not follow up in the ED, you may risk poor outcomes including worsening of condition, delayed treatment and potentially life threatening issues. If you have declined to go to the ED at this time, you should call your PCP immediately to set up a follow up appointment.

## 2023-04-14 NOTE — ED Triage Notes (Signed)
Patient states that he's been having abdominal pain that's moving around his abdomin. Started Monday then went away. Came back after eating lunch yesterday. Hasn't ate anything since lunch yesterday. Last BM this morning. Diarrhea

## 2023-04-14 NOTE — ED Triage Notes (Addendum)
Pt to ed from [facility] for abdominal pain x 2 days. Pt was seen at [facility] and they sent him here. Pt is CAOx4, in no acute distress and ambulatory in triage. Pt feels his pain is worse after meals. Denies any urinary symptoms. Pt called his old PCP but hasn't been seen there in 5 years so they wouldn't see him.

## 2023-04-15 DIAGNOSIS — K5720 Diverticulitis of large intestine with perforation and abscess without bleeding: Secondary | ICD-10-CM | POA: Diagnosis not present

## 2023-04-15 LAB — MAGNESIUM: Magnesium: 2.5 mg/dL — ABNORMAL HIGH (ref 1.7–2.4)

## 2023-04-15 LAB — BASIC METABOLIC PANEL
Anion gap: 9 (ref 5–15)
BUN: 15 mg/dL (ref 6–20)
CO2: 25 mmol/L (ref 22–32)
Calcium: 8.8 mg/dL — ABNORMAL LOW (ref 8.9–10.3)
Chloride: 103 mmol/L (ref 98–111)
Creatinine, Ser: 0.93 mg/dL (ref 0.61–1.24)
GFR, Estimated: >60 mL/min (ref >60)
Glucose, Bld: 90 mg/dL (ref 70–99)
Potassium: 3.4 mmol/L — ABNORMAL LOW (ref 3.5–5.1)
Sodium: 137 mmol/L (ref 135–145)

## 2023-04-15 LAB — CBC
HCT: 44 % (ref 39.0–52.0)
Hemoglobin: 14.6 g/dL (ref 13.0–17.0)
MCH: 29.4 pg (ref 26.0–34.0)
MCHC: 33.2 g/dL (ref 30.0–36.0)
MCV: 88.7 fL (ref 80.0–100.0)
Platelets: 184 10*3/uL (ref 150–400)
RBC: 4.96 MIL/uL (ref 4.22–5.81)
RDW: 12.7 % (ref 11.5–15.5)
WBC: 9.4 10*3/uL (ref 4.0–10.5)
nRBC: 0 % (ref 0.0–0.2)

## 2023-04-15 LAB — HIV ANTIBODY (ROUTINE TESTING W REFLEX): HIV Screen 4th Generation wRfx: NONREACTIVE

## 2023-04-15 MED ORDER — POTASSIUM CHLORIDE CRYS ER 20 MEQ PO TBCR
40.0000 meq | EXTENDED_RELEASE_TABLET | Freq: Once | ORAL | Status: AC
  Administered 2023-04-15: 40 meq via ORAL
  Filled 2023-04-15: qty 2

## 2023-04-15 MED ORDER — SODIUM CHLORIDE 0.9% FLUSH
3.0000 mL | Freq: Two times a day (BID) | INTRAVENOUS | Status: DC
  Administered 2023-04-15 – 2023-04-16 (×3): 3 mL via INTRAVENOUS

## 2023-04-15 NOTE — Discharge Instructions (Signed)
Some PCP options in [place] area- not a comprehensive list  [facility]- [phone] [facility]- [phone] [facility]- [phone] [facility]- [phone] [facility]- [phone] [facility]- [phone]  or [facility] Physician Referral Line [phone]

## 2023-04-15 NOTE — TOC CM/SW Note (Addendum)
Transition of Care [facility]) - Inpatient Brief Assessment   Patient Details  Name: Eloise Mula MRN: [id number] Date of Birth: [birth year]  Transition of Care [facility]) CM/SW Contact:    Chapman Fitch, RN Phone Number: 04/15/2023, 9:28 AM   Clinical Narrative:  No PCP on file.  List of local PCP added to avs Transition of Care [facility]) Screening Note   Patient Details  Name: Dewell Monnier Date of Birth: 11-15-1980   Transition of Care [facility]) CM/SW Contact:    Chapman Fitch, RN Phone Number: 04/15/2023, 9:29 AM    Transition of Care Department [facility]) has reviewed patient and no TOC needs have been identified at this time. If new patient transition needs arise, please place a TOC consult.    Transition of Care Asessment: Insurance and Status: Insurance coverage has been reviewed Patient has primary care physician: Yes     Prior/Current Home Services: No current home services Social Determinants of Health Reivew: SDOH reviewed no interventions necessary Readmission risk has been reviewed: Yes Transition of care needs: no transition of care needs at this time

## 2023-04-15 NOTE — Progress Notes (Signed)
[facility] SURGICAL PROGRESS NOTE (cpt [phone])  Hospital Day(s): 1.   Interval History: Patient seen and examined, no acute events or new complaints overnight. He has maintained hemodynamics. Patient reports he is feeling much better. Able to sleep last night. No significant abdominal pain this morning. No fever, chills, nausea, emesis. Previously seen leukocytosis now resolved; WBC 9.4K this AM. Hgb to 14.6. Renal function normal; sCr - 0.93; UO - unmeasured. Mild hypokalemia to 3.4. Hypermagnesemia to 2.5. NPO overnight; reports being hungry. Continues on Zosyn.   Review of Systems:  Constitutional: denies fever, chills  HEENT: denies cough or congestion  Respiratory: denies any shortness of breath  Cardiovascular: denies chest pain or palpitations  Gastrointestinal: denies abdominal pain, N/V Genitourinary: denies burning with urination or urinary frequency Musculoskeletal: denies pain, decreased motor or sensation  Vital signs in last 24 hours: [min-max] current  Temp:  [98.2 F (36.8 C)-98.8 F (37.1 C)] 98.7 F (37.1 C) (10/17 0231) Pulse Rate:  [61-76] 61 (10/17 0231) Resp:  [14-18] 18 (10/17 0231) BP: (95-131)/(56-93) 95/56 (10/17 0231) SpO2:  [95 %-100 %] 96 % (10/17 0231) Weight:  [111.1 kg] 111.1 kg (10/16 1512)     Height: 6' (182.9 cm) Weight: 111.1 kg BMI (Calculated): 33.22   Intake/Output last 2 shifts:  10/16 0701 - 10/17 0700 In: 652.6 [I.V.:591.3; IV Piggyback:61.3] Out: -    Physical Exam:  Constitutional: alert, cooperative and no distress  HENT: normocephalic without obvious abnormality  Eyes: PERRL, EOM's grossly intact and symmetric  Respiratory: breathing non-labored at rest  Cardiovascular: regular rate and sinus rhythm  Gastrointestinal: soft, non-tender, and non-distended. No rebound/guarding. He is certainly not peritonitic.  Musculoskeletal: no edema or wounds, motor and sensation grossly intact, NT    Labs:     Latest Ref Rng  & Units 04/15/2023    4:17 AM 04/14/2023    3:15 PM 02/27/2019    7:10 AM  CBC  WBC 4.0 - 10.5 K/uL 9.4  13.0  9.4   Hemoglobin 13.0 - 17.0 g/dL 23.7  62.8  31.5   Hematocrit 39.0 - 52.0 % 44.0  45.8  46.8   Platelets 150 - 400 K/uL 184  201  215       Latest Ref Rng & Units 04/15/2023    4:17 AM 04/14/2023    3:15 PM 02/27/2019    7:10 AM  CMP  Glucose 70 - 99 mg/dL 90  176  160   BUN 6 - 20 mg/dL 15  13  17    Creatinine 0.61 - 1.24 mg/dL 7.37  1.06  2.69   Sodium 135 - 145 mmol/L 137  134  139   Potassium 3.5 - 5.1 mmol/L 3.4  3.4  3.9   Chloride 98 - 111 mmol/L 103  100  108   CO2 22 - 32 mmol/L 25  25  22    Calcium 8.9 - 10.3 mg/dL 8.8  9.0  9.4   Total Protein 6.5 - 8.1 g/dL  8.0  7.6   Total Bilirubin 0.3 - 1.2 mg/dL  1.6  0.7   Alkaline Phos 38 - 126 U/L  67  71   AST 15 - 41 U/L  23  37   ALT 0 - 44 U/L  26  35     Imaging studies: No new pertinent imaging studies   Assessment/Plan: 42 y.o. male with acute cecal diverticulitis with perforation without abscess nor peritonitis   - Will advance to [facility]  - Continue  IV Abx (Zosyn)  - No need for surgical intervention currently. He understands that if he fails to improve or clinically deteriorates, we will need to consider more urgent intervention   - Monitor abdominal examination; on-going bowel function   - Pain control prn; antiemetics prn - Replete K+; monitor  - Monitor leukocytosis; resolved   - Mobilize    All of the above findings and recommendations were discussed with the patient, and the medical team, and all of patient's questions were answered to his expressed satisfaction.  -- Lynden Oxford, PA-C [facility] 04/15/2023, 7:27 AM M-F: 7am - 4pm

## 2023-04-16 DIAGNOSIS — K5720 Diverticulitis of large intestine with perforation and abscess without bleeding: Secondary | ICD-10-CM | POA: Diagnosis not present

## 2023-04-16 LAB — BASIC METABOLIC PANEL
Anion gap: 8 (ref 5–15)
BUN: 10 mg/dL (ref 6–20)
CO2: 25 mmol/L (ref 22–32)
Calcium: 8.8 mg/dL — ABNORMAL LOW (ref 8.9–10.3)
Chloride: 104 mmol/L (ref 98–111)
Creatinine, Ser: 0.93 mg/dL (ref 0.61–1.24)
GFR, Estimated: >60 mL/min (ref >60)
Glucose, Bld: 93 mg/dL (ref 70–99)
Potassium: 3.6 mmol/L (ref 3.5–5.1)
Sodium: 137 mmol/L (ref 135–145)

## 2023-04-16 LAB — CBC
HCT: 43.7 % (ref 39.0–52.0)
Hemoglobin: 14.5 g/dL (ref 13.0–17.0)
MCH: 29.5 pg (ref 26.0–34.0)
MCHC: 33.2 g/dL (ref 30.0–36.0)
MCV: 89 fL (ref 80.0–100.0)
Platelets: 194 10*3/uL (ref 150–400)
RBC: 4.91 MIL/uL (ref 4.22–5.81)
RDW: 12.5 % (ref 11.5–15.5)
WBC: 8.8 10*3/uL (ref 4.0–10.5)
nRBC: 0 % (ref 0.0–0.2)

## 2023-04-16 MED ORDER — POTASSIUM CHLORIDE CRYS ER 20 MEQ PO TBCR
40.0000 meq | EXTENDED_RELEASE_TABLET | Freq: Once | ORAL | Status: AC
  Administered 2023-04-16: 40 meq via ORAL
  Filled 2023-04-16: qty 2

## 2023-04-16 MED ORDER — AMOXICILLIN-POT CLAVULANATE 875-125 MG PO TABS: 1.0000 | ORAL_TABLET | Freq: Two times a day (BID) | ORAL | 0 refills | Status: AC

## 2023-04-16 NOTE — Discharge Summary (Signed)
[facility] SURGICAL DISCHARGE SUMMARY (cpt: [phone])  Patient ID: John Randall MRN: [id number] DOB/AGE: 01/13/1981 42 y.o.  Admit date: 04/14/2023 Discharge date: 04/16/2023  Discharge Diagnoses Patient Active Problem List   Diagnosis Date Noted   Diverticulitis of large intestine with perforation 04/14/2023    Consultants None  Procedures None  HPI: John Randall is a 42 y.o. male presenting with 2 days of worsening abdominal pain.  The patient and his wife report that he's had on and off abdominal pain, mostly in the right lower quadrant for the past few months, but the patient had not sought any medical attention for it.  Sometimes he will skip work because of the pain.  The pain does not last long and resolves on its own.  However, yesterday his pain was more significant compared to prior episodes.  The pain started in the mid abdomen and then radiated to the right lower quadrant.  Denies any fevers or chills, chest pain, shortness of breath, nausea, vomiting.  In the ED, his workup showed a WBC of 13 and a CT scan showed perforated cecal diverticulitis with small foci of air but no abscess.  His appendix was within normal.   Hospital Course: Patient was admitted to general surgery service and underwent conservative management. On HD1, his pain had resolved and WBC had normalized. Advancement of patient's diet over the next 24 hours and ambulation were well-tolerated. The remainder of patient's hospital course was essentially unremarkable, and discharge planning was initiated accordingly with patient safely able to be discharged home with appropriate discharge instructions, antibiotics (Augmentin x7 days), pain control, and outpatient follow-up after all of his questions were answered to his expressed satisfaction.   Discharge Condition: Good   Physical Examination:  Constitutional: Well appearing male, NAD Pulmonary: Normal effort, no respiratory  distress Gastrointestinal: Soft, non-tender, non-distended, no rebound/guarding Skin: Warm, dry    Allergies as of 04/16/2023   No Known Allergies      Medication List     TAKE these medications    albuterol 108 (90 Base) MCG/ACT inhaler Commonly known as: VENTOLIN HFA Inhale 2 puffs into the lungs every 6 (six) hours as needed for wheezing or shortness of breath.   amoxicillin-clavulanate 875-125 MG tablet Commonly known as: AUGMENTIN Take 1 tablet by mouth 2 (two) times daily for 7 days.   naproxen 500 MG tablet Commonly known as: Naprosyn Take 1 tablet (500 mg total) by mouth 2 (two) times daily with a meal.   pantoprazole 40 MG tablet Commonly known as: PROTONIX Take 1 tablet (40 mg total) by mouth daily.          Follow-up Information     Henrene Dodge, MD. Go on 05/10/2023.   Specialty: General Surgery Why: Go to appointment on 11/11 at 900 AM Contact information: [address] [place] [address] [phone]                  Time spent on discharge management including discussion of hospital course, clinical condition, outpatient instructions, prescriptions, and follow up with the patient and members of the medical team: >30 minutes  -- Lynden Oxford , PA-C [facility]  04/16/2023, 8:24 AM [phone] M-F: 7am - 4pm

## 2023-04-30 ENCOUNTER — Ambulatory Visit: Payer: BC Managed Care – PPO | Admitting: Physician Assistant

## 2023-04-30 ENCOUNTER — Encounter: Payer: Self-pay | Admitting: Physician Assistant

## 2023-04-30 ENCOUNTER — Telehealth: Payer: Self-pay | Admitting: Physician Assistant

## 2023-04-30 VITALS — BP 112/80 | HR 61 | Temp 98.2°F | Resp 16 | Ht 72.0 in | Wt 239.2 lb

## 2023-04-30 DIAGNOSIS — G4710 Hypersomnia, unspecified: Secondary | ICD-10-CM

## 2023-04-30 DIAGNOSIS — R946 Abnormal results of thyroid function studies: Secondary | ICD-10-CM | POA: Diagnosis not present

## 2023-04-30 DIAGNOSIS — R5383 Other fatigue: Secondary | ICD-10-CM

## 2023-04-30 DIAGNOSIS — K5720 Diverticulitis of large intestine with perforation and abscess without bleeding: Secondary | ICD-10-CM | POA: Diagnosis not present

## 2023-04-30 DIAGNOSIS — E782 Mixed hyperlipidemia: Secondary | ICD-10-CM

## 2023-04-30 DIAGNOSIS — Z7689 Persons encountering health services in other specified circumstances: Secondary | ICD-10-CM

## 2023-04-30 NOTE — Telephone Encounter (Signed)
Awaiting 04/30/23 office notes for SS order-Toni

## 2023-04-30 NOTE — Progress Notes (Unsigned)
[facility] [address] [place], [place] [address]  Internal MEDICINE  Office Visit Note  Patient Name: John Randall  [id number]  [id number]  Date of Service: 05/05/2023   Complaints/HPI Pt is here for establishment of PCP. Chief Complaint  Patient presents with   New Patient (Initial Visit)   Diverticulitis   HPI Pt is here to establish care -recently was in the hospital with diverticulitis, goes to [facility] on 11/11 -Is doing better, still a little cramping/pain -Wakes up a lot in the middle of the night. Has been better the last week -does snore at night, some gasping, choking and thought this was more reflux than apnea -never feels well rested, always tired -Hx of OSA, tried cpap and did not like it and stopped using. This was 15 years ago. Open to retesting  -will order labs  Current Medication: Outpatient Encounter Medications as of 04/30/2023  Medication Sig   [DISCONTINUED] albuterol (PROVENTIL HFA;VENTOLIN HFA) 108 (90 Base) MCG/ACT inhaler Inhale 2 puffs into the lungs every 6 (six) hours as needed for wheezing or shortness of breath.   [DISCONTINUED] naproxen (NAPROSYN) 500 MG tablet Take 1 tablet (500 mg total) by mouth 2 (two) times daily with a meal.   [DISCONTINUED] pantoprazole (PROTONIX) 40 MG tablet Take 1 tablet (40 mg total) by mouth daily.   No facility-administered encounter medications on file as of 04/30/2023.    Surgical History: Past Surgical History:  Procedure Laterality Date   EYE SURGERY      Medical History: Past Medical History:  Diagnosis Date   Acid reflux    Diverticulitis 2024   L4-5 disc bulge 06/27/2015   Sleep apnea     Family History: Family History  Problem Relation Age of Onset   Heart disease Father     Social History   Socioeconomic History   Marital status: Divorced    Spouse name: Not on file   Number of children: Not on file   Years of education: Not on file   Highest education level: Not on  file  Occupational History   Not on file  Tobacco Use   Smoking status: Former   Smokeless tobacco: Never  Vaping Use   Vaping status: Never Used  Substance and Sexual Activity   Alcohol use: No    Alcohol/week: 0.0 standard drinks of alcohol   Drug use: Not Currently   Sexual activity: Yes    Birth control/protection: None  Other Topics Concern   Not on file  Social History Narrative   Not on file   Social Determinants of Health   Financial Resource Strain: Not on file  Food Insecurity: No Food Insecurity (04/14/2023)   Hunger Vital Sign    Worried About Running Out of Food in the Last Year: Never true    Ran Out of Food in the Last Year: Never true  Transportation Needs: No Transportation Needs (04/14/2023)   PRAPARE - Administrator, Civil Service (Medical): No    Lack of Transportation (Non-Medical): No  Physical Activity: Not on file  Stress: Not on file  Social Connections: Not on file  Intimate Partner Violence: Not At Risk (04/14/2023)   Humiliation, Afraid, Rape, and Kick questionnaire    Fear of Current or Ex-Partner: No    Emotionally Abused: No    Physically Abused: No    Sexually Abused: No     Review of Systems  Constitutional:  Negative for chills, fatigue and unexpected weight change.  HENT:  Positive for postnasal drip. Negative for congestion, rhinorrhea, sneezing and sore throat.   Eyes:  Negative for redness.  Respiratory:  Negative for cough, chest tightness and shortness of breath.   Cardiovascular:  Negative for chest pain and palpitations.  Gastrointestinal:  Positive for abdominal pain.  Genitourinary:  Negative for dysuria and frequency.  Musculoskeletal:  Negative for arthralgias, back pain, joint swelling and neck pain.  Skin:  Negative for rash.  Neurological: Negative.  Negative for tremors and numbness.  Hematological:  Negative for adenopathy. Does not bruise/bleed easily.  Psychiatric/Behavioral:  Positive for sleep  disturbance. Negative for behavioral problems (Depression) and suicidal ideas. The patient is not nervous/anxious.     Vital Signs: BP 112/80   Pulse 61   Temp 98.2 F (36.8 C)   Resp 16   Ht 6' (1.829 m)   Wt 239 lb 3.2 oz (108.5 kg)   SpO2 97%   BMI 32.44 kg/m    Physical Exam Vitals and nursing note reviewed.  Constitutional:      General: He is not in acute distress.    Appearance: Normal appearance. He is well-developed. He is not diaphoretic.  HENT:     Head: Normocephalic and atraumatic.     Mouth/Throat:     Pharynx: No oropharyngeal exudate.  Eyes:     Pupils: Pupils are equal, round, and reactive to light.  Neck:     Thyroid: No thyromegaly.     Vascular: No JVD.     Trachea: No tracheal deviation.  Cardiovascular:     Rate and Rhythm: Normal rate and regular rhythm.     Heart sounds: Normal heart sounds. No murmur heard.    No friction rub. No gallop.  Pulmonary:     Effort: Pulmonary effort is normal.  Abdominal:     General: Bowel sounds are normal.     Palpations: Abdomen is soft.  Musculoskeletal:        General: Normal range of motion.     Cervical back: Normal range of motion and neck supple.  Lymphadenopathy:     Cervical: No cervical adenopathy.  Skin:    General: Skin is warm and dry.  Neurological:     Mental Status: He is alert and oriented to person, place, and time.     Cranial Nerves: No cranial nerve deficit.  Psychiatric:        Behavior: Behavior normal.        Thought Content: Thought content normal.        Judgment: Judgment normal.       Assessment/Plan: 1. Hypersomnia Based on snoring, gasping, choking in sleep, fatigue, nightly awakenings, history of OSA, and elevated BMI, will order updated sleep test - [facility] SLEEP STUDY; Future  2. Diverticulitis of large intestine with perforation without bleeding Followed by [facility]  3. Abnormal thyroid exam - TSH + free T4  4. Mixed hyperlipidemia - Lipid Panel With LDL/HDL  Ratio  5. Other fatigue - [facility] SLEEP STUDY; Future - Comprehensive metabolic panel - Lipid Panel With LDL/HDL Ratio - TSH + free T4 - CBC w/Diff/Platelet  6. Encounter to establish care Reviewed medical hx and ordered labs   General Counseling: John Randall understanding of the findings of todays visit and agrees with plan of treatment. I have discussed any further diagnostic evaluation that may be needed or ordered today. We also reviewed his medications today. he has been encouraged to call the office with any questions or concerns that should arise  related to todays visit.    Counseling:    Orders Placed This Encounter  Procedures   Comprehensive metabolic panel   Lipid Panel With LDL/HDL Ratio   TSH + free T4   CBC w/Diff/Platelet   PSG SLEEP STUDY    No orders of the defined types were placed in this encounter.    This patient was seen by Lynn Ito, PA-C in collaboration with Dr. Beverely Risen as a part of collaborative care agreement.   Time spent:35 Minutes

## 2023-05-06 ENCOUNTER — Telehealth: Payer: Self-pay | Admitting: Physician Assistant

## 2023-05-06 NOTE — Telephone Encounter (Signed)
SS order faxed to [facility]; [phone]

## 2023-05-10 ENCOUNTER — Encounter: Payer: Self-pay | Admitting: Surgery

## 2023-05-10 ENCOUNTER — Ambulatory Visit (INDEPENDENT_AMBULATORY_CARE_PROVIDER_SITE_OTHER): Payer: BC Managed Care – PPO | Admitting: Surgery

## 2023-05-10 VITALS — BP 123/77 | HR 62 | Temp 98.1°F | Ht 72.0 in | Wt 236.2 lb

## 2023-05-10 DIAGNOSIS — K5720 Diverticulitis of large intestine with perforation and abscess without bleeding: Secondary | ICD-10-CM

## 2023-05-10 DIAGNOSIS — K5732 Diverticulitis of large intestine without perforation or abscess without bleeding: Secondary | ICD-10-CM

## 2023-05-10 NOTE — Patient Instructions (Signed)
High-Fiber Eating Plan Fiber, also called dietary fiber, is found in foods such as fruits, vegetables, whole grains, and beans. A high-fiber diet can be good for your health. Your health care provider may recommend a high-fiber diet to help: Prevent trouble pooping (constipation). Lower your cholesterol. Treat the following conditions: Hemorrhoids. This is inflammation of veins in the anus. Inflammation of specific areas of the digestive tract. Irritable bowel syndrome (IBS). This is a problem of the large intestine, also called the colon, that sometimes causes belly pain and bloating. Prevent overeating as part of a weight-loss plan. Lower the risk of heart disease, type 2 diabetes, and certain cancers. What are tips for following this plan? Reading food labels  Check the nutrition facts label on foods for the amount of dietary fiber. Choose foods that have 4 grams of fiber or more per serving. The recommended goals for how much fiber you should eat each day include: Males 46 years old or younger: 30-34 g. Males over 64 years old: 28-34 g. Females 89 years old or younger: 25-28 g. Females over 33 years old: 22-25 g. Your daily fiber goal is _____________ g. Shopping Choose whole fruits and vegetables instead of processed. For example, choose apples instead of apple juice or applesauce. Choose a variety of high-fiber foods such as avocados, lentils, oats, and pinto beans. Read the nutrition facts label on foods. Check for foods with added fiber. These foods often have high sugar and salt (sodium) amounts per serving. Cooking Use whole-grain flour for baking and cooking. Cook with brown rice instead of white rice. Make meals that have a lot of beans and vegetables in them, such as chili or vegetable-based soups. Meal planning Start the day with a breakfast that is high in fiber, such as a cereal that has 5 g of fiber or more per serving. Eat breads and cereals that are made with  whole-grain flour instead of refined flour or white flour. Eat brown rice, bulgur wheat, or millet instead of white rice. Use beans in place of meat in soups, salads, and pasta dishes. Be sure that half of the grains you eat each day are whole grains. General information You can get the recommended amount of dietary fiber by: Eating a variety of fruits, vegetables, grains, nuts, and beans. Taking a fiber supplement if you aren't able to eat enough fiber. It's better to get fiber through food than from a supplement. Slowly increase how much fiber you eat. If you increase the amount of fiber you eat too quickly, you may have bloating, cramping, or gas. Drink plenty of water to help you digest fiber. Choose high-fiber snacks, such as berries, raw vegetables, nuts, and popcorn. What foods should I eat? Fruits Berries. Pears. Apples. Oranges. Avocado. Prunes and raisins. Dried figs. Vegetables Sweet potatoes. Spinach. Kale. Artichokes. Cabbage. Broccoli. Cauliflower. Green peas. Carrots. Squash. Grains Whole-grain breads. Multigrain cereal. Oats and oatmeal. Brown rice. Barley. Bulgur wheat. Millet. Quinoa. Bran muffins. Popcorn. Rye wafer crackers. Meats and other proteins Navy beans, kidney beans, and pinto beans. Soybeans. Split peas. Lentils. Nuts and seeds. Dairy Fiber-fortified yogurt. Fortified means that fiber has been added to the product. Beverages Fiber-fortified soy milk. Fiber-fortified orange juice. Other foods Fiber bars. The items listed above may not be all the foods and drinks you can have. Talk to a dietitian to learn more. What foods should I avoid? Fruits Fruit juice. Cooked, strained fruit. Vegetables Fried potatoes. Canned vegetables. Well-cooked vegetables. Grains White bread. Pasta made with refined  flour. White rice. Meats and other proteins Fatty meat. Fried chicken or fried fish. Dairy Milk. Cream cheese. Sour cream. Fats and  oils Butters. Beverages Soft drinks. Other foods Cakes and pastries. The items listed above may not be all the foods and drinks you should avoid. Talk to a dietitian to learn more. This information is not intended to replace advice given to you by your health care provider. Make sure you discuss any questions you have with your health care provider. Document Revised: 09/07/2022 Document Reviewed: 09/07/2022 Elsevier Patient Education  2024 Else[employer] Inc.   Diverticulitis  Diverticulitis is when small pouches in your colon get infected or swollen. This causes pain in your belly (abdomen) and watery poop (diarrhea). The small pouches are called diverticula. They may form if you have a condition called diverticulosis. What are the causes? You may get this condition if poop (stool) gets trapped in the pouches in your colon. The poop lets germs (bacteria) grow. This causes an infection. What increases the risk? You are more likely to get this condition if you have small pouches in your colon. You are also more likely to get it if: You are overweight or very overweight (obese). You do not exercise enough. You drink alcohol. You smoke. You eat a lot of red meat, like beef, pork, or lamb. You do not eat enough fiber. You are older than 41 years of age. What are the signs or symptoms? Pain in your belly. Pain is often on the left side, but it may be felt in other spots too. Fever and chills. Feeling like you may vomit. Vomiting. Having cramps. Feeling full. Changes in how often you poop. Blood in your poop. How is this treated? Most cases are treated at home. You may be told to: Take over-the-counter pain medicines. Only eat and drink clear liquids. Take antibiotics. Rest. Very bad cases may need to be treated at a hospital. Treatment may include: Not eating or drinking. Taking pain medicines. Getting antibiotics through an IV tube. Getting fluid and food through an IV  tube. Having surgery. When you are feeling better, you may need to have a test to look at your colon (colonoscopy). Follow these instructions at home: Medicines Take over-the-counter and prescription medicines only as told by your doctor. These include: Fiber pills. Probiotics. Medicines to make your poop soft (stool softeners). If you were prescribed antibiotics, take them as told by your doctor. Do not stop taking them even if you start to feel better. Ask your doctor if you should avoid driving or using machines while you are taking your medicine. Eating and drinking  Follow the diet told by your doctor. You may need to only eat and drink liquids. When you feel better, you may be able to eat more foods. You may also be told to eat a lot of fiber. Fiber helps you poop. Foods with fiber include berries, beans, lentils, and green vegetables. Try not to eat red meat. General instructions Do not smoke or use any products that contain nicotine or tobacco. If you need help quitting, ask your doctor. Exercise 3 or more times a week. Try to go for 30 minutes each time. Exercise enough to sweat and make your heart beat faster. Contact a doctor if: Your pain gets worse. You are not pooping like normal. Your symptoms do not get better. Your symptoms get worse very fast. You have a fever. You vomit more than one time. You have poop that is: Bloody. Black. Tarry.  This information is not intended to replace advice given to you by your health care provider. Make sure you discuss any questions you have with your health care provider. Document Revised: 03/12/2022 Document Reviewed: 03/12/2022 Elsevier Patient Education  2024 [employer].

## 2023-05-10 NOTE — Progress Notes (Signed)
05/10/2023  History of Present Illness: John Randall is a 42 y.o. male presenting for follow up of cecal diverticulitis with perforation.  He was admitted on 04/14/23 with RLQ pain and CT scan findings showing cecal diverticulitis with perforation and small foci of air.  He was treated conservatively with IV antibiotics with resolution of his symptoms.  Today, he presents for follow up.  He reports that he has been doing well.  Report only some mild sporadic pressure in the low abdomen but is very transient and is wondering if it's hunger pain instead.  He had made some dietary changes with less fast food.  Denies any pain in the right lower quadrant.  Past Medical History: Past Medical History:  Diagnosis Date   Acid reflux    Diverticulitis 2024   L4-5 disc bulge 06/27/2015   Sleep apnea      Past Surgical History: Past Surgical History:  Procedure Laterality Date   EYE SURGERY      Home Medications: Prior to Admission medications   Not on File    Allergies: No Known Allergies  Review of Systems: Review of Systems  Constitutional:  Negative for chills and fever.  Respiratory:  Negative for shortness of breath.   Cardiovascular:  Negative for chest pain.  Gastrointestinal:  Positive for abdominal pain (mild transient discomfort only). Negative for nausea and vomiting.    Physical Exam BP 123/77 (BP Location: Right Arm, Patient Position: Sitting, Cuff Size: Large)   Pulse 62   Temp 98.1 F (36.7 C) (Oral)   Ht 6' (1.829 m)   Wt 236 lb 3.2 oz (107.1 kg)   SpO2 96%   BMI 32.03 kg/m  CONSTITUTIONAL: No acute distress HEENT:  Normocephalic, atraumatic, extraocular motion intact. RESPIRATORY:  Normal respiratory effort without pathologic use of accessory muscles. CARDIOVASCULAR: Regular rhythm and rate GI: The abdomen is soft, non-distended, currently non-tender to palpation.  No peritonitis.  NEUROLOGIC:  Motor and sensation is grossly normal.  Cranial nerves  are grossly intact. PSYCH:  Alert and oriented to person, place and time. Affect is normal.  Labs/Imaging: CT abdomen/pelvis on 04/14/23: IMPRESSION: 1. Cecal diverticulitis with contained perforation. No abscess. Normal appendix. 2. Nonobstructing right nephrolithiasis. 3. Hepatic steatosis.  Assessment and Plan: This is a 42 y.o. male with cecal diverticulitis with perforation.  --Discussed with the patient again the findings on his CT scan and the conservative treatment that was done during his hospital stay.  Discussed the degrees of perforation with diverticulitis and how the management can change based on severity.  His case is technically an episode of complicated diverticulitis with the perforation.  Discussed with him that the main recommendation would be in the form of surgery to remove the area of the cecum where the perforation was located.  However, before that, he would also need to get a colonoscopy to further evaluate and make sure there were no occult masses that caused the perforation.   --Also discussed recommendation for a high fiber diet to help make the stool softer so there's less pressure through the colon.  Will give more information on diverticulitis and diet. --Will send referral to GI for colonoscopy hopefully towards the end of December.  He will follow up with me in January to discuss the results and to discuss more about the option for surgery if he is interested.  I spent 30 minutes dedicated to the care of this patient on the date of this encounter to include pre-visit review of records,  face-to-face time with the patient discussing diagnosis and management, and any post-visit coordination of care.   Howie Ill, MD [facility]

## 2023-05-11 LAB — COMPREHENSIVE METABOLIC PANEL
ALT: 32 [IU]/L (ref 0–44)
AST: 24 [IU]/L (ref 0–40)
Albumin: 4.6 g/dL (ref 4.1–5.1)
Alkaline Phosphatase: 97 [IU]/L (ref 44–121)
BUN/Creatinine Ratio: 15 (ref 9–20)
BUN: 16 mg/dL (ref 6–24)
Bilirubin Total: 0.4 mg/dL (ref 0.0–1.2)
CO2: 24 mmol/L (ref 20–29)
Calcium: 10.1 mg/dL (ref 8.7–10.2)
Chloride: 102 mmol/L (ref 96–106)
Creatinine, Ser: 1.09 mg/dL (ref 0.76–1.27)
Globulin, Total: 2.8 g/dL (ref 1.5–4.5)
Glucose: 95 mg/dL (ref 70–99)
Potassium: 4.9 mmol/L (ref 3.5–5.2)
Sodium: 140 mmol/L (ref 134–144)
Total Protein: 7.4 g/dL (ref 6.0–8.5)
eGFR: 87 mL/min/{1.73_m2} (ref >59)

## 2023-05-11 LAB — LIPID PANEL WITH LDL/HDL RATIO
Cholesterol, Total: 281 mg/dL — ABNORMAL HIGH (ref 100–199)
HDL: 43 mg/dL (ref >39)
LDL Chol Calc (NIH): 197 mg/dL — ABNORMAL HIGH (ref 0–99)
LDL/HDL Ratio: 4.6 ratio — ABNORMAL HIGH (ref 0.0–3.6)
Triglycerides: 211 mg/dL — ABNORMAL HIGH (ref 0–149)
VLDL Cholesterol Cal: 41 mg/dL — ABNORMAL HIGH (ref 5–40)

## 2023-05-11 LAB — CBC WITH DIFFERENTIAL/PLATELET
Basophils Absolute: 0.1 10*3/uL (ref 0.0–0.2)
Basos: 1 %
EOS (ABSOLUTE): 0.1 10*3/uL (ref 0.0–0.4)
Eos: 2 %
Hematocrit: 50.9 % (ref 37.5–51.0)
Hemoglobin: 16.4 g/dL (ref 13.0–17.7)
Immature Grans (Abs): 0 10*3/uL (ref 0.0–0.1)
Immature Granulocytes: 0 %
Lymphocytes Absolute: 2.1 10*3/uL (ref 0.7–3.1)
Lymphs: 35 %
MCH: 29.5 pg (ref 26.6–33.0)
MCHC: 32.2 g/dL (ref 31.5–35.7)
MCV: 92 fL (ref 79–97)
Monocytes Absolute: 0.5 10*3/uL (ref 0.1–0.9)
Monocytes: 8 %
Neutrophils Absolute: 3.2 10*3/uL (ref 1.4–7.0)
Neutrophils: 54 %
Platelets: 244 10*3/uL (ref 150–450)
RBC: 5.56 x10E6/uL (ref 4.14–5.80)
RDW: 12 % (ref 11.6–15.4)
WBC: 5.9 10*3/uL (ref 3.4–10.8)

## 2023-05-11 LAB — TSH+FREE T4
Free T4: 0.96 ng/dL (ref 0.82–1.77)
TSH: 1.96 u[IU]/mL (ref 0.450–4.500)

## 2023-05-21 ENCOUNTER — Encounter: Payer: Self-pay | Admitting: Physician Assistant

## 2023-05-21 ENCOUNTER — Ambulatory Visit (INDEPENDENT_AMBULATORY_CARE_PROVIDER_SITE_OTHER): Payer: BC Managed Care – PPO | Admitting: Physician Assistant

## 2023-05-21 VITALS — BP 132/84 | HR 73 | Temp 98.5°F | Resp 16 | Ht 72.0 in | Wt 239.0 lb

## 2023-05-21 DIAGNOSIS — E782 Mixed hyperlipidemia: Secondary | ICD-10-CM

## 2023-05-21 DIAGNOSIS — K5720 Diverticulitis of large intestine with perforation and abscess without bleeding: Secondary | ICD-10-CM | POA: Diagnosis not present

## 2023-05-21 DIAGNOSIS — Z0001 Encounter for general adult medical examination with abnormal findings: Secondary | ICD-10-CM | POA: Diagnosis not present

## 2023-05-21 MED ORDER — ROSUVASTATIN CALCIUM 5 MG PO TABS
5.0000 mg | ORAL_TABLET | Freq: Every day | ORAL | 3 refills | Status: DC
Start: 2023-05-21 — End: 2023-07-30

## 2023-05-21 NOTE — Progress Notes (Signed)
[facility] [address] [place], [place] [address]  Internal MEDICINE  Office Visit Note  Patient Name: John Randall  [id number]  [id number]  Date of Service: 06/04/2023  Chief Complaint  Patient presents with   Annual Exam   Gastroesophageal Reflux     HPI Pt is here for routine health maintenance examination -Has been back to [facility] for diverticulitis with perf, wants to hold on surgery and have colonoscopy first. Hasn't heard about this yet -Labs reviewed: high cholesterol, already working on diet. Willing to try low dose statin -Will follow up on sleep study--he has not heard about scheduling yet  Current Medication: Outpatient Encounter Medications as of 05/21/2023  Medication Sig   rosuvastatin (CRESTOR) 5 MG tablet Take 1 tablet (5 mg total) by mouth daily.   No facility-administered encounter medications on file as of 05/21/2023.    Surgical History: Past Surgical History:  Procedure Laterality Date   EYE SURGERY      Medical History: Past Medical History:  Diagnosis Date   Acid reflux    Diverticulitis 2024   L4-5 disc bulge 06/27/2015   Sleep apnea     Family History: Family History  Problem Relation Age of Onset   Heart disease Father       Review of Systems  Constitutional:  Negative for chills, fatigue and unexpected weight change.  HENT:  Positive for postnasal drip. Negative for congestion, rhinorrhea, sneezing and sore throat.   Eyes:  Negative for redness.  Respiratory:  Negative for cough, chest tightness and shortness of breath.   Cardiovascular:  Negative for chest pain and palpitations.  Genitourinary:  Negative for dysuria and frequency.  Musculoskeletal:  Negative for arthralgias, back pain, joint swelling and neck pain.  Skin:  Negative for rash.  Neurological: Negative.  Negative for tremors and numbness.  Hematological:  Negative for adenopathy. Does not bruise/bleed easily.  Psychiatric/Behavioral:  Positive  for sleep disturbance. Negative for behavioral problems (Depression) and suicidal ideas. The patient is not nervous/anxious.      Vital Signs: BP 132/84 Comment: 125/90  Pulse 73   Temp 98.5 F (36.9 C)   Resp 16   Ht 6' (1.829 m)   Wt 239 lb (108.4 kg)   SpO2 98%   BMI 32.41 kg/m    Physical Exam Vitals and nursing note reviewed.  Constitutional:      General: He is not in acute distress.    Appearance: Normal appearance. He is well-developed. He is not diaphoretic.  HENT:     Head: Normocephalic and atraumatic.     Mouth/Throat:     Pharynx: No oropharyngeal exudate.  Eyes:     Pupils: Pupils are equal, round, and reactive to light.  Neck:     Thyroid: No thyromegaly.     Vascular: No JVD.     Trachea: No tracheal deviation.  Cardiovascular:     Rate and Rhythm: Normal rate and regular rhythm.     Heart sounds: Normal heart sounds. No murmur heard.    No friction rub. No gallop.  Pulmonary:     Effort: Pulmonary effort is normal.  Abdominal:     General: Bowel sounds are normal.     Palpations: Abdomen is soft.  Musculoskeletal:        General: Normal range of motion.     Cervical back: Normal range of motion and neck supple.  Lymphadenopathy:     Cervical: No cervical adenopathy.  Skin:    General: Skin  is warm and dry.  Neurological:     Mental Status: He is alert and oriented to person, place, and time.     Cranial Nerves: No cranial nerve deficit.  Psychiatric:        Behavior: Behavior normal.        Thought Content: Thought content normal.        Judgment: Judgment normal.      LABS: Recent Results (from the past 2160 hour(s))  Urinalysis, Routine w reflex microscopic -Urine, Clean Catch     Status: Abnormal   Collection Time: 04/14/23  3:14 PM  Result Value Ref Range   Color, Urine YELLOW (A) YELLOW   APPearance CLEAR (A) CLEAR   Specific Gravity, Urine 1.027 1.005 - 1.030   pH 5.0 5.0 - 8.0   Glucose, UA NEGATIVE NEGATIVE mg/dL   Hgb  urine dipstick NEGATIVE NEGATIVE   Bilirubin Urine NEGATIVE NEGATIVE   Ketones, ur NEGATIVE NEGATIVE mg/dL   Protein, ur NEGATIVE NEGATIVE mg/dL   Nitrite NEGATIVE NEGATIVE   Leukocytes,Ua NEGATIVE NEGATIVE    Comment: Performed at [facility], [address] [place], [place] [address]  Lipase, blood     Status: None   Collection Time: 04/14/23  3:15 PM  Result Value Ref Range   Lipase 25 11 - 51 U/L    Comment: Performed at [facility], [address] [place], [place] [address]  Comprehensive metabolic panel     Status: Abnormal   Collection Time: 04/14/23  3:15 PM  Result Value Ref Range   Sodium 134 (L) 135 - 145 mmol/L   Potassium 3.4 (L) 3.5 - 5.1 mmol/L   Chloride 100 98 - 111 mmol/L   CO2 25 22 - 32 mmol/L   Glucose, Bld 101 (H) 70 - 99 mg/dL    Comment: Glucose reference range applies only to samples taken after fasting for at least 8 hours.   BUN 13 6 - 20 mg/dL   Creatinine, Ser 8.29 0.61 - 1.24 mg/dL   Calcium 9.0 8.9 - 56.2 mg/dL   Total Protein 8.0 6.5 - 8.1 g/dL   Albumin 4.3 3.5 - 5.0 g/dL   AST 23 15 - 41 U/L   ALT 26 0 - 44 U/L   Alkaline Phosphatase 67 38 - 126 U/L   Total Bilirubin 1.6 (H) 0.3 - 1.2 mg/dL   GFR, Estimated >13 >08 mL/min    Comment: (NOTE) Calculated using the CKD-EPI Creatinine Equation (2021)    Anion gap 9 5 - 15    Comment: Performed at [facility], [address] [place], [place] [address]  CBC     Status: Abnormal   Collection Time: 04/14/23  3:15 PM  Result Value Ref Range   WBC 13.0 (H) 4.0 - 10.5 K/uL   RBC 5.08 4.22 - 5.81 MIL/uL   Hemoglobin 15.0 13.0 - 17.0 g/dL   HCT 69.6 29.5 - 28.4 %   MCV 90.2 80.0 - 100.0 fL   MCH 29.5 26.0 - 34.0 pg   MCHC 32.8 30.0 - 36.0 g/dL   RDW 13.2 44.0 - 10.2 %   Platelets 201 150 - 400 K/uL   nRBC 0.0 0.0 - 0.2 %    Comment: Performed at [facility], [address] [place], [place] [address]  HIV Antibody (routine testing w rflx)     Status:  None   Collection Time: 04/15/23  4:17 AM  Result Value Ref Range   HIV Screen 4th  Generation wRfx Non Reactive Non Reactive    Comment: Performed at [facility], [address], [place], [place] [address]  Basic metabolic panel     Status: Abnormal   Collection Time: 04/15/23  4:17 AM  Result Value Ref Range   Sodium 137 135 - 145 mmol/L   Potassium 3.4 (L) 3.5 - 5.1 mmol/L   Chloride 103 98 - 111 mmol/L   CO2 25 22 - 32 mmol/L   Glucose, Bld 90 70 - 99 mg/dL    Comment: Glucose reference range applies only to samples taken after fasting for at least 8 hours.   BUN 15 6 - 20 mg/dL   Creatinine, Ser 6.04 0.61 - 1.24 mg/dL   Calcium 8.8 (L) 8.9 - 10.3 mg/dL   GFR, Estimated >54 >09 mL/min    Comment: (NOTE) Calculated using the CKD-EPI Creatinine Equation (2021)    Anion gap 9 5 - 15    Comment: Performed at [facility], [address] [place], [place] [address]  Magnesium     Status: Abnormal   Collection Time: 04/15/23  4:17 AM  Result Value Ref Range   Magnesium 2.5 (H) 1.7 - 2.4 mg/dL    Comment: Performed at [facility], [address] [place], [place] [address]  CBC     Status: None   Collection Time: 04/15/23  4:17 AM  Result Value Ref Range   WBC 9.4 4.0 - 10.5 K/uL   RBC 4.96 4.22 - 5.81 MIL/uL   Hemoglobin 14.6 13.0 - 17.0 g/dL   HCT 56.2 13.0 - 86.5 %   MCV 88.7 80.0 - 100.0 fL   MCH 29.4 26.0 - 34.0 pg   MCHC 33.2 30.0 - 36.0 g/dL   RDW 78.4 69.6 - 29.5 %   Platelets 184 150 - 400 K/uL   nRBC 0.0 0.0 - 0.2 %    Comment: Performed at [facility], [address] [place], [place] [address]  CBC     Status: None   Collection Time: 04/16/23  4:22 AM  Result Value Ref Range   WBC 8.8 4.0 - 10.5 K/uL   RBC 4.91 4.22 - 5.81 MIL/uL   Hemoglobin 14.5 13.0 - 17.0 g/dL   HCT 24.4 01.0 - 27.2 %   MCV 89.0 80.0 - 100.0 fL   MCH 29.5 26.0 - 34.0 pg   MCHC 33.2 30.0 - 36.0 g/dL   RDW 53.6 64.4 - 03.4 %   Platelets 194 150 -  400 K/uL   nRBC 0.0 0.0 - 0.2 %    Comment: Performed at [facility], [address] [place], [place] [address]  Basic metabolic panel     Status: Abnormal   Collection Time: 04/16/23  4:22 AM  Result Value Ref Range   Sodium 137 135 - 145 mmol/L   Potassium 3.6 3.5 - 5.1 mmol/L   Chloride 104 98 - 111 mmol/L   CO2 25 22 - 32 mmol/L   Glucose, Bld 93 70 - 99 mg/dL    Comment: Glucose reference range applies only to samples taken after fasting for at least 8 hours.   BUN 10 6 - 20 mg/dL   Creatinine, Ser 5.63 0.61 - 1.24 mg/dL   Calcium 8.8 (L) 8.9 - 10.3 mg/dL   GFR, Estimated >87 >56 mL/min    Comment: (NOTE) Calculated using the CKD-EPI Creatinine Equation (2021)    Anion gap 8 5 - 15    Comment: Performed  at [facility], [address] [place], [place] [address]  Comprehensive metabolic panel     Status: None   Collection Time: 05/10/23 10:17 AM  Result Value Ref Range   Glucose 95 70 - 99 mg/dL   BUN 16 6 - 24 mg/dL   Creatinine, Ser 7.84 0.76 - 1.27 mg/dL   eGFR 87 >69 GE/XBM/8.41   BUN/Creatinine Ratio 15 9 - 20   Sodium 140 134 - 144 mmol/L   Potassium 4.9 3.5 - 5.2 mmol/L   Chloride 102 96 - 106 mmol/L   CO2 24 20 - 29 mmol/L   Calcium 10.1 8.7 - 10.2 mg/dL   Total Protein 7.4 6.0 - 8.5 g/dL   Albumin 4.6 4.1 - 5.1 g/dL   Globulin, Total 2.8 1.5 - 4.5 g/dL   Bilirubin Total 0.4 0.0 - 1.2 mg/dL   Alkaline Phosphatase 97 44 - 121 IU/L   AST 24 0 - 40 IU/L   ALT 32 0 - 44 IU/L  Lipid Panel With LDL/HDL Ratio     Status: Abnormal   Collection Time: 05/10/23 10:17 AM  Result Value Ref Range   Cholesterol, Total 281 (H) 100 - 199 mg/dL   Triglycerides 324 (H) 0 - 149 mg/dL   HDL 43 >40 mg/dL   VLDL Cholesterol Cal 41 (H) 5 - 40 mg/dL   LDL Chol Calc (NIH) 102 (H) 0 - 99 mg/dL   LDL CALC COMMENT: Comment     Comment: Consider evaluating for Familial Hypercholesterolemia(FH), if clinically indicated.    LDL/HDL Ratio 4.6 (H) 0.0 - 3.6 ratio     Comment:                                     LDL/HDL Ratio                                             Men  Women                               1/2 Avg.Risk  1.0    1.5                                   Avg.Risk  3.6    3.2                                2X Avg.Risk  6.2    5.0                                3X Avg.Risk  8.0    6.1   TSH + free T4     Status: None   Collection Time: 05/10/23 10:17 AM  Result Value Ref Range   TSH 1.960 0.450 - 4.500 uIU/mL   Free T4 0.96 0.82 - 1.77 ng/dL  CBC w/Diff/Platelet     Status: None   Collection Time: 05/10/23 10:17 AM  Result Value Ref Range   WBC 5.9 3.4 - 10.8 x10E3/uL   RBC 5.56 4.14 - 5.80 x10E6/uL  Hemoglobin 16.4 13.0 - 17.7 g/dL   Hematocrit 57.3 22.0 - 51.0 %   MCV 92 79 - 97 fL   MCH 29.5 26.6 - 33.0 pg   MCHC 32.2 31.5 - 35.7 g/dL   RDW 25.4 27.0 - 62.3 %   Platelets 244 150 - 450 x10E3/uL   Neutrophils 54 Not Estab. %   Lymphs 35 Not Estab. %   Monocytes 8 Not Estab. %   Eos 2 Not Estab. %   Basos 1 Not Estab. %   Neutrophils Absolute 3.2 1.4 - 7.0 x10E3/uL   Lymphocytes Absolute 2.1 0.7 - 3.1 x10E3/uL   Monocytes Absolute 0.5 0.1 - 0.9 x10E3/uL   EOS (ABSOLUTE) 0.1 0.0 - 0.4 x10E3/uL   Basophils Absolute 0.1 0.0 - 0.2 x10E3/uL   Immature Granulocytes 0 Not Estab. %   Immature Grans (Abs) 0.0 0.0 - 0.1 x10E3/uL       Assessment/Plan: 1. Encounter for general adult medical examination with abnormal findings CPE performed, labs reviewed, UTD on PHM  2. Mixed hyperlipidemia Will start crestor and work on diet/exercise - rosuvastatin (CRESTOR) 5 MG tablet; Take 1 tablet (5 mg total) by mouth daily.  Dispense: 90 tablet; Refill: 3  3. Diverticulitis of large intestine with perforation without bleeding Following with [facility]   General Counseling: John Randall understanding of the findings of todays visit and agrees with plan of treatment. I have discussed any further diagnostic evaluation that may be needed  or ordered today. We also reviewed his medications today. he has been encouraged to call the office with any questions or concerns that should arise related to todays visit.    Counseling:    No orders of the defined types were placed in this encounter.   Meds ordered this encounter  Medications   rosuvastatin (CRESTOR) 5 MG tablet    Sig: Take 1 tablet (5 mg total) by mouth daily.    Dispense:  90 tablet    Refill:  3    This patient was seen by Lynn Ito, PA-C in collaboration with Dr. Beverely Risen as a part of collaborative care agreement.  Total time spent:35 Minutes  Time spent includes review of chart, medications, test results, and follow up plan with the patient.     Lyndon Code, MD  Internal Medicine

## 2023-07-28 ENCOUNTER — Other Ambulatory Visit: Payer: Self-pay

## 2023-07-29 NOTE — Progress Notes (Signed)
Celso Amy, PA-C [address]  [address]  [place], [place] [address]  Main: [phone]  Fax: [phone]   Gastroenterology Consultation  Referring Provider:     Carlean Jews, PA* Primary Care Physician:  Carlean Jews, PA-C Primary Gastroenterologist:  Celso Amy, PA-C  Reason for Consultation:     F/U of Diverticulitis        HPI:   Sharrieff Spratlin is a 43 y.o. y/o male referred for consultation & management  by Carlean Jews, PA-C.    Hospitalized at [facility] 04/14/2023 -04/16/2023.  Had complicated contained perforated cecal diverticulitis with no abscess.  Treated with conservative management and antibiotics.  Discharged on Augmentin.  Had follow-up with general surgeon Dr. Aleen Campi 05/10/2023.  Has not had any more RLQ pain abdominal pain since hospitalization.  This was his first episode of diverticulitis.  No previous colonoscopy or GI evaluation.  He has not had any more abdominal pain since he finished Augmentin antibiotic in October.  He denies any current GI symptoms such as abdominal pain, diarrhea, constipation, rectal bleeding, or weight loss.  No family history of colon cancer.  Past Medical History:  Diagnosis Date   Acid reflux    Diverticulitis 2024   L4-5 disc bulge 06/27/2015   Sleep apnea     Past Surgical History:  Procedure Laterality Date   EYE SURGERY      Prior to Admission medications   Medication Sig Start Date End Date Taking? Authorizing Provider  HYDROcodone-acetaminophen (NORCO/VICODIN) 5-325 MG tablet Take one tablet at night for pain; may take up to every 6 hours as needed for pain if not working or driving 69/62/95   [provider]  metaxalone (SKELAXIN) 800 MG tablet Take 1 tablet by mouth 3 (three) times daily. 06/13/18   [provider]  rosuvastatin (CRESTOR) 5 MG tablet Take 1 tablet (5 mg total) by mouth daily. 05/21/23   McDonough, Salomon Fick, PA-C    Family History  Problem  Relation Age of Onset   Heart disease Father      Social History   Tobacco Use   Smoking status: Former   Smokeless tobacco: Never  Vaping Use   Vaping status: Never Used  Substance Use Topics   Alcohol use: No    Alcohol/week: 0.0 standard drinks of alcohol   Drug use: Not Currently    Allergies as of 07/30/2023   (No Known Allergies)    Review of Systems:    All systems reviewed and negative except where noted in HPI.   Physical Exam:  BP 119/73   Pulse 72   Temp 97.8 F (36.6 C)   Ht 6' (1.829 m)   Wt 247 lb (112 kg)   BMI 33.50 kg/m  No LMP for male patient.  General:   Alert,  Well-developed, well-nourished, pleasant and cooperative in NAD Lungs:  Respirations even and unlabored.  Clear throughout to auscultation.   No wheezes, crackles, or rhonchi. No acute distress. Heart:  Regular rate and rhythm; no murmurs, clicks, rubs, or gallops. Abdomen:  Normal bowel sounds.  No bruits.  Soft, and non-distended without masses, hepatosplenomegaly or hernias noted.  No Tenderness.  No guarding or rebound tenderness.    Neurologic:  Alert and oriented x3;  grossly normal neurologically. Psych:  Alert and cooperative. Normal mood and affect.  Imaging Studies: No results found.  Assessment and Plan:   Mekai Wilkinson is a 43 y.o. y/o male has been referred  for hospital follow-up of acute cecal diverticulitis with mild perforation 03/2023 -first episode.  Resolved with antibiotic treatment.  No current abdominal pain or tenderness.  1.  History of diverticulitis with small cecal perforation; currently resolved  Scheduling Colonoscopy I discussed risks of colonoscopy with patient to include risk of bleeding, colon perforation, and risk of sedation.  Patient expressed understanding and agrees to proceed with colonoscopy.    Patient education handout given from up-to-date.  Education discussed.  Return if he has any recurrent abdominal pain.  Follow up as needed based on  colonoscopy results and GI symptoms.  Celso Amy, PA-C

## 2023-07-30 ENCOUNTER — Ambulatory Visit (INDEPENDENT_AMBULATORY_CARE_PROVIDER_SITE_OTHER): Payer: BC Managed Care – PPO | Admitting: Physician Assistant

## 2023-07-30 ENCOUNTER — Encounter (INDEPENDENT_AMBULATORY_CARE_PROVIDER_SITE_OTHER): Payer: BC Managed Care – PPO | Admitting: Internal Medicine

## 2023-07-30 ENCOUNTER — Telehealth: Payer: Self-pay

## 2023-07-30 ENCOUNTER — Encounter: Payer: Self-pay | Admitting: Physician Assistant

## 2023-07-30 VITALS — BP 119/73 | HR 72 | Temp 97.8°F | Ht 72.0 in | Wt 247.0 lb

## 2023-07-30 DIAGNOSIS — Z09 Encounter for follow-up examination after completed treatment for conditions other than malignant neoplasm: Secondary | ICD-10-CM | POA: Diagnosis not present

## 2023-07-30 DIAGNOSIS — G4733 Obstructive sleep apnea (adult) (pediatric): Secondary | ICD-10-CM | POA: Diagnosis not present

## 2023-07-30 DIAGNOSIS — Z8719 Personal history of other diseases of the digestive system: Secondary | ICD-10-CM | POA: Diagnosis not present

## 2023-07-30 MED ORDER — NA SULFATE-K SULFATE-MG SULF 17.5-3.13-1.6 GM/177ML PO SOLN: 1.0000 | Freq: Once | ORAL | 0 refills | Status: AC

## 2023-07-30 NOTE — Telephone Encounter (Signed)
I called the patient to see if him wanted to come a hour early and there was no answer I left a voicemail.

## 2023-08-10 NOTE — Procedures (Signed)
[facility]  Polysomnogram Report Part I                                                               Phone: [phone] Fax: [phone]  Patient Name: John Randall, John Randall Acquisition Number: [id number]  Date of Birth: 09/18/1980 Acquisition Date: 07/30/2023  Referring Physician: Lynn Ito, PA-C     History: The patient is a 43 year old  who was referred for evaluation of possible sleep apnea with snoring and excessive daytime sleepiness. Medical History: hypersomnia, diverticultis, acid reflux, L4-5 disc bulge.  Medications: None reported.  Procedure: This routine overnight polysomnogram was performed on the Alice 5 using the standard diagnostic protocol. This included 6 channels of EEG, 2 channels of EOG, chin EMG, bilateral anterior tibialis EMG, nasal/oral thermistor, PTAF (nasal pressure transducer), chest and abdominal wall movements, EKG, and pulse oximetry.  Description: The total recording time was 397.5 minutes. The total sleep time was 335.0 minutes. There were a total of 20.5 minutes of wakefulness after sleep onset for a reducedsleep efficiency of 84.3%. The latency to sleep onset was prolonged at 42.0 minutes. The R sleep onset latency was prolonged at 138.0 minutes. Sleep parameters, as a percentage of the total sleep time, demonstrated 1.9% of sleep was in N1 sleep, 80.6% N2, 0.0% N3 and 17.5% R sleep. There were a total of 142 arousals for an arousal index of 25.4 arousals per hour of sleep that was elevated.  Respiratory monitoring demonstrated   snoring . There were 33 apneas and hypopneas for an Apnea Hypopnea Index of 5.9 apneas and hypopneas per hour of sleep. The REM related apnea hypopnea index was 23.6/hr of REM sleep compared to a NREM AHI of 2.2/hr.  The average duration of the respiratory events was 13.2 seconds with a maximum duration of 26.0 seconds. The respiratory events occurred . The respiratory events were associated with peripheral oxygen  desaturations on the average to 88%. The lowest oxygen desaturation associated with a respiratory event was 76%. Additionally, the baseline oxygen saturation during wakefulness was 94%, during NREM sleep averaged 94%, and during REM sleep averaged 93%. The total duration of oxygen < 90% was 4.8 minutes and <80% was 0.2 minutes.  Cardiac monitoring-  demonstrate transient cardiac decelerations associated with the apneas.  significant cardiac rhythm irregularities.   Periodic limb movement monitoring- demonstrated that there were 148 periodic limb movements for a periodic limb movement index of 26.5 periodic limb movements per hour of sleep.     Impression: This routine overnight polysomnogram demonstrated significant, REM-related obstructive sleep apnea with an overall Apnea Hypopnea Index of 5.9 apneas and hypopneas per hour of sleep, which increased to 23.6 in REM sleep. The lowest desaturation was to 76%.    There was a significantly elevated periodic limb movement index of 26.5 periodic limb movements per hour of sleep. Sometimes these limb movements subside once the apnea is controlled. Clinical correlation is suggested.  reduced sleep efficiency with anelevated arousal index,increased awakenings, a slightly reduced REM percentage and no slow wave sleep. These findings would appear to be due to the combination of obstructive sleep apnea and periodic limb movements.  Recommendations:    A CPAP titration would be recommended for the sleep apnea.  Would recommend weight loss in  a patient with a BMI of 32.4.  Alternative treatment options may include an oral appliance, a nasal resistance device, or ENT surgery in the appropriate clinical context.     [name], MD, [facility] Diplomate ABMS-Pulmonary, Critical Care and Sleep Medicine  Electronically reviewed and digitally signed  [facility] Polysomnogram Report Part II  Phone: [phone] Fax: [phone]  Patient last  name Randall Neck Size 17.0 in. Acquisition [id number]  Patient first name John Weight 239.0 lbs. Started 07/30/2023 at 11:53:28 PM  Birth date 1981/05/12 Height 72.0 in. Stopped 07/31/2023 at 6:46:46 AM  Age 71 BMI 32.4 lb/in2 Duration 397.5  Study Type Adult      Robbi Garter, RPSGT & Ja'Net Rennis Harding    Reviewed by: Valentino Hue. Henke, PhD, ABSM, FAASM Sleep Data: Lights Out: 12:05:58 AM Sleep Onset: 12:47:58 AM  Lights On: 6:43:28 AM Sleep Efficiency: 84.3 %  Total Recording Time: 397.5 min Sleep Latency (from Lights Off) 42.0 min  Total Sleep Time (TST): 335.0 min R Latency (from Sleep Onset): 138.0 min  Sleep Period Time: 355.5 min Total number of awakenings: 23  Wake during sleep: 20.5 min Wake After Sleep Onset (WASO): 20.5 min   Sleep Data:         Arousal Summary: Stage  Latency from lights out (min) Latency from sleep onset (min) Duration (min) % Total Sleep Time  Normal values  N 1 42.0 0.0 6.5 1.9 (5%)  N 2 42.5 0.5 270.0 80.6 (50%)  N 3       0.0 0.0 (20%)  R 180.0 138.0 58.5 17.5 (25%)   Number Index  Spontaneous 62 11.1  Apneas & Hypopneas 6 1.1  RERAs 0 0.0       (Apneas & Hypopneas & RERAs)  (6) (1.1)  Limb Movement 79 14.1  Snore 0 0.0  TOTAL 147 26.3     Respiratory Data:  CA OA MA Apnea Hypopnea* A+ H RERA Total  Number 0 13 0 13 20 33 0 33  Mean Dur (sec) 0.0 12.5 0.0 12.5 13.7 13.2 0.0 13.2  Max Dur (sec) 0.0 26.0 0.0 26.0 25.5 26.0 0.0 26.0  Total Dur (min) 0.0 2.7 0.0 2.7 4.5 7.3 0.0 7.3  % of TST 0.0 0.8 0.0 0.8 1.4 2.2 0.0 2.2  Index (#/h TST) 0.0 2.3 0.0 2.3 3.6 5.9 0.0 5.9  *Hypopneas scored based on 4% or greater desaturation.  Sleep Stage:        REM NREM TST  AHI 23.6 2.2 5.9  RDI 23.6 2.2 5.9           Body Position Data:  Sleep (min) TST (%) REM (min) NREM (min) CA (#) OA (#) MA (#) HYP (#) AHI (#/h) RERA (#) RDI (#/h) Desat (#)  Supine 69.5 20.75 0.0 69.5 0 1 0 6 6.0 0 6.0 16  Non-Supine 265.50 79.25 58.50 207.00 0.00 12.00  0.00 14.00 5.88 0 5.88 62.00  Left: 193.6 57.79 58.5 135.1 0 12 0 14 8.1 0 8.1 47  Right: 71.9 21.46 0.0 71.9 0 0 0 0 0.0 0 0.00 15     Snoring: Total number of snoring episodes  0  Total time with snoring    min (   % of sleep)   Oximetry Distribution:             WK REM NREM TOTAL  Average (%)   94 93 94 94  < 90% 0.0 4.8 0.0 4.8  < 80%  0.0 0.2 0.0 0.2  < 70% 0.0 0.0 0.0 0.0  # of Desaturations* 4 30 44 78  Desat Index (#/hour) 3.9 30.8 9.5 14.0  Desat Max (%) 6 18 7 18   Desat Max Dur (sec) 23.0 75.0 120.0 120.0  Approx Min O2 during sleep 76  Approx min O2 during a respiratory event 76  Was Oxygen added (Y/N) and final rate :    LPM  *Desaturations based on 4% or greater drop from baseline.   Cheyne Stokes Breathing: None Present   Heart Rate Summary:  Average Heart Rate During Sleep 62.0 bpm      Highest Heart Rate During Sleep (95th %) 70.0 bpm      Highest Heart Rate During Sleep 114 bpm      Highest Heart Rate During Recording (TIB) 175 bpm (artifact)   Heart Rate Observations: Event Type # Events   Bradycardia 0 Lowest HR Scored: N/A  Sinus Tachycardia During Sleep 0 Highest HR Scored: N/A  Narrow Complex Tachycardia 0 Highest HR Scored: N/A  Wide Complex Tachycardia 0 Highest HR Scored: N/A  Asystole 0 Longest Pause: N/A  Atrial Fibrillation 0 Duration Longest Event: N/A  Other Arrythmias   Type:    Periodic Limb Movement Data: (Primary legs unless otherwise noted) Total # Limb Movement 172 Limb Movement Index 30.8  Total # PLMS 148 PLMS Index 26.5  Total # PLMS Arousals 60 PLMS Arousal Index 10.7  Percentage Sleep Time with PLMS 79.18min (23.8 % sleep)  Mean Duration limb movements (secs) 682.6

## 2023-08-11 ENCOUNTER — Telehealth: Payer: Self-pay | Admitting: Physician Assistant

## 2023-08-11 NOTE — Telephone Encounter (Signed)
Patient had SS done 07/30/23. Results scanned. [facility] to set up Cpap Titration-Toni

## 2023-08-20 ENCOUNTER — Ambulatory Visit: Payer: BC Managed Care – PPO | Admitting: Physician Assistant

## 2023-08-25 ENCOUNTER — Telehealth: Payer: Self-pay

## 2023-08-25 NOTE — Telephone Encounter (Signed)
 The patient called in to check if his procedure was cancel.

## 2023-08-26 ENCOUNTER — Encounter: Admission: RE | Payer: Self-pay | Source: Home / Self Care

## 2023-08-26 ENCOUNTER — Ambulatory Visit
Admission: RE | Admit: 2023-08-26 | Payer: BC Managed Care – PPO | Source: Home / Self Care | Admitting: Gastroenterology

## 2023-08-26 SURGERY — COLONOSCOPY WITH PROPOFOL
Anesthesia: General

## 2023-08-27 ENCOUNTER — Ambulatory Visit: Payer: BC Managed Care – PPO | Admitting: Physician Assistant

## 2023-09-23 ENCOUNTER — Ambulatory Visit: Payer: BC Managed Care – PPO | Admitting: Physician Assistant

## 2023-10-16 ENCOUNTER — Encounter (INDEPENDENT_AMBULATORY_CARE_PROVIDER_SITE_OTHER): Payer: Self-pay | Admitting: Internal Medicine

## 2023-10-16 DIAGNOSIS — G4733 Obstructive sleep apnea (adult) (pediatric): Secondary | ICD-10-CM

## 2023-10-22 NOTE — Procedures (Signed)
 [facility]  Polysomnogram Report Part I  Phone: [phone] Fax: [phone]  Patient Name: John Randall, John Randall. Acquisition Number: [id number]  Date of Birth: 28-Jul-1980 Acquisition Date: 10/16/2023  Referring Physician: Taylor Favia, PA-C     History: The patient is a 43 year old  . Medical History: hypersomnia, acid reflux, L4-5 disc bulge and diverticultis.  Medications: none reported.  Procedure: This routine overnight polysomnogram was performed on the Alice 5 using the standard CPAP protocol. This included 6 channels of EEG, 2 channels of EOG, chin EMG, bilateral anterior tibialis EMG, nasal/oral thermistor, PTAF (nasal pressure transducer), chest and abdominal wall movements, EKG, and pulse oximetry.  Description: The total recording time was 373.6 minutes. The total sleep time was 329.0 minutes. There were a total of 40.9 minutes of wakefulness after sleep onset for a slightly reducedsleep efficiency of 88.1%. The latency to sleep onset was shortat 3.7 minutes. The R sleep onset latency was prolonged at 267.0 minutes. Sleep parameters, as a percentage of the total sleep time, demonstrated 18.5% of sleep was in N1 sleep, 51.2% N2, 15.2% N3 and 15.0% R sleep. There were a total of 308 arousals for an arousal index of 56.2 arousals per hour of sleep that was elevated.  Overall, there were a total of 30 respiratory events for a respiratory disturbance index, which includes apneas, hypopneas and RERAs (increased respiratory effort) of 5.5 respiratory events per hour of sleep during the pressure titration.  was initiated at 7 cm H2O at lights out, 12:04 a.m. It was titrated in 1-2 cm increments for intermittent respiratory events to 11 cm H2O. The apnea was controlled at this pressure and supine, REM sleep was observed. The pressure was further titrated to the final pressure of 12 cm H2O.   Additionally, the baseline oxygen saturation during wakefulness was 97%, during NREM sleep  averaged 96%, and during REM sleep averaged 97%. The total duration of oxygen < 90% was 0.0 minutes.  Cardiac monitoring-  significant cardiac rhythm irregularities.   Periodic limb movement monitoring- demonstrated that there were 182 periodic limb movements for a periodic limb movement index of 33.2 periodic limb movements per hour of sleep.   Impression: This patient's obstructive sleep apnea demonstrated significant improvement with the utilization of nasal  at 11 cm H2O.    There was a significantly elevated periodic limb movement index of 33.2 periodic limb movements per hour of sleep. Sometimes these limb movements appear transiently with the initiation of CPAP. Clinical correlation is suggested.  Recommendations: Would recommend utilization of nasal  at 11 cm H2O.      A medium ResMed Mirage Quattro Full Face mask, size, was used. Chin strap used during study- no. Humidifier used during study- yes.     John Deters, MD, [facility] Diplomate ABMS-Pulmonary, Critical Care and Sleep Medicine  Electronically reviewed and digitally signed  [facility] CPAP/BIPAP Polysomnogram Report Part II Phone: [phone] Fax: [phone]  Patient last name Ramires Neck Size 17.0 in. Acquisition [phone]  Patient first name John Randall. Weight 239.0 lbs. Started 10/16/2023 at 11:56:02 PM  Birth date 1981/01/29 Height 72.0 in. Stopped 10/17/2023 at 6:59:56 AM  Age 59      Type Adult BMI 32.4 lb/in2 Duration 373.6  Report generated by Jackalyn Martyr, RPSGT   Reviewed by: Delmus Ferri. Henke, PhD, ABSM, FAASM Sleep Data: Lights Out: 12:04:20 AM Sleep Onset: 12:08:02 AM  Lights On: 6:17:56 AM Sleep Efficiency: 88.1 %  Total Recording Time: 373.6  min Sleep Latency (from Lights Off) 3.7 min  Total Sleep Time (TST): 329.0 min R Latency (from Sleep Onset): 267.0 min  Sleep Period Time: 369.5 min Total number of awakenings: 35  Wake during sleep: 40.5 min Wake After Sleep Onset (WASO): 40.9 min    Sleep Data:         Arousal Summary: Stage  Latency from lights out (min) Latency from sleep onset (min) Duration (min) % Total Sleep Time  Normal values  N 1 3.7 0.0 61.0 18.5 (5%)  N 2 4.7 1.0 168.5 51.2 (50%)  N 3 73.7 70.0 50.0 15.2 (20%)  R 270.7 267.0 49.5 15.0 (25%)   Number Index  Spontaneous 189 34.5  Apneas & Hypopneas 6 1.1  RERAs 14 2.6       (Apneas & Hypopneas & RERAs)  (20) (3.6)  Limb Movement 106 19.3  Snore 0 0.0  TOTAL 315 57.4     Respiratory Data:  CA OA MA Apnea Hypopnea* A+ H RERA Total  Number 0 1 0 1 15 16 14 30   Mean Dur (sec) 0.0 16.5 0.0 16.5 21.3 21.0 17.4 19.3  Max Dur (sec) 0.0 16.5 0.0 16.5 43.5 43.5 25.5 43.5  Total Dur (min) 0.0 0.3 0.0 0.3 5.3 5.6 4.1 9.7  % of TST 0.0 0.1 0.0 0.1 1.6 1.7 1.2 2.9  Index (#/h TST) 0.0 0.2 0.0 0.2 2.7 2.9 2.6 5.5  *Hypopneas scored based on 4% or greater desaturation.  Sleep Stage:         REM NREM TST  AHI 0.0 3.4 2.9  RDI 0.0 6.4 5.5   Sleep (min) TST (%) REM (min) NREM (min) CA (#) OA (#) MA (#) HYP (#) AHI (#/h) RERA (#) RDI (#/h) Desat (#)  Supine 193.7 58.88 49.5 144.2 0 1 0 12 4.0 14 8.4 36  Non-Supine 135.30 41.12 0.00 135.30 0.00 0.00 0.00 3.00 1.33 0 1.33 11.00  Left: 135.3 41.12 0.0 135.3 0 0 0 3 1.3 0 1.3 11     Snoring: Total number of snoring episodes  0  Total time with snoring    min (   % of sleep)   Oximetry Distribution:             WK REM NREM TOTAL  Average (%)   97 97 96 96  < 90% 0.0 0.0 0.0 0.0  < 80% 0.0 0.0 0.0 0.0  < 70% 0.0 0.0 0.0 0.0  # of Desaturations* 6 3 35 44  Desat Index (#/hour) 8.1 3.6 7.5 8.0  Desat Max (%) 8 5 8 8   Desat Max Dur (sec) 119.0 50.0 59.0 119.0  Approx Min O2 during sleep 90  Approx min O2 during a respiratory event 90  Was Oxygen added (Y/N) and final rate :    LPM  *Desaturations based on 3% or greater drop from baseline.   Cheyne Stokes Breathing: None Present   Heart Rate Summary:  Average Heart Rate During Sleep  49.2 bpm      Highest Heart Rate During Sleep (95th %) 66.0 bpm      Highest Heart Rate During Sleep 235 bpm (artifact)  Highest Heart Rate During Recording (TIB) 239 bpm (artifact)   Heart Rate Observations: Event Type # Events   Bradycardia 0 Lowest HR Scored: N/A  Sinus Tachycardia During Sleep 0 Highest HR Scored: N/A  Narrow Complex Tachycardia 0 Highest HR Scored: N/A  Wide Complex Tachycardia 0 Highest HR Scored: N/A  Asystole 0  Longest Pause: N/A  Atrial Fibrillation 0 Duration Longest Event: N/A  Other Arrythmias   Type:   Periodic Limb Movement Data: (Primary legs unless otherwise noted) Total # Limb Movement 221 Limb Movement Index 40.3  Total # PLMS 182 PLMS Index 33.2  Total # PLMS Arousals 75 PLMS Arousal Index 13.7  Percentage Sleep Time with PLMS 75.47min (22.8 % sleep)  Mean Duration limb movements (secs) 563.7    IPAP Level (cmH2O) EPAP Level (cmH2O) Total Duration (min) Sleep Duration (min) Sleep (%) REM (%) CA  #) OA # MA # HYP #) AHI (#/hr) RERAs # RERAs (#/hr) RDI (#/hr)  7 7 78.4 65.4 83.4 0.0 0 0 0 11 10.1 7 6.4 16.5  9 9  146.5 133.8 91.3 0.0 0 1 0 4 2.2 3 1.3 3.6  11 9  2.9 0.0 0.0 0.0 0 0 0 0 0.0 0 0.0 0.0  11 11 61.3 53.9 87.9 10.6 0 0 0 0 0.0 4 4.5 4.5  12 12  79.8 75.3 94.4 53.9 0 0 0 0 0.0 0 0.0 0.0

## 2023-10-25 ENCOUNTER — Telehealth: Payer: Self-pay | Admitting: Physician Assistant

## 2023-10-25 NOTE — Telephone Encounter (Addendum)
 Received Cpap & supply order from [facility]. Gave to Lauren for [employer].Faxed back to [facility]; [phone]. Scanned-Toni

## 2024-05-22 ENCOUNTER — Telehealth: Payer: Self-pay | Admitting: Physician Assistant

## 2024-05-22 NOTE — Telephone Encounter (Signed)
 Left vm and sent mychart message to confirm 05/29/24 appointment-Toni

## 2024-05-29 ENCOUNTER — Encounter: Payer: BC Managed Care – PPO | Admitting: Physician Assistant

## 2024-05-31 ENCOUNTER — Telehealth: Payer: Self-pay | Admitting: Physician Assistant

## 2024-05-31 NOTE — Telephone Encounter (Signed)
 Lvm to reschedule 05/29/2024 missed appointment-Toni

## 2024-07-13 ENCOUNTER — Telehealth: Payer: Self-pay | Admitting: Physician Assistant

## 2024-07-13 NOTE — Telephone Encounter (Signed)
 Left vm and sent mychart message to confirm 07/20/24 appointment-Toni

## 2024-07-20 ENCOUNTER — Encounter: Admitting: Physician Assistant

## 2024-07-22 ENCOUNTER — Encounter: Payer: Self-pay | Admitting: Emergency Medicine

## 2024-07-22 ENCOUNTER — Emergency Department
Admission: EM | Admit: 2024-07-22 | Discharge: 2024-07-23 | Disposition: A | Payer: Self-pay | Attending: Emergency Medicine | Admitting: Emergency Medicine

## 2024-07-22 ENCOUNTER — Other Ambulatory Visit: Payer: Self-pay

## 2024-07-22 ENCOUNTER — Emergency Department: Payer: Self-pay

## 2024-07-22 DIAGNOSIS — R1031 Right lower quadrant pain: Secondary | ICD-10-CM

## 2024-07-22 DIAGNOSIS — N200 Calculus of kidney: Secondary | ICD-10-CM

## 2024-07-22 DIAGNOSIS — D72829 Elevated white blood cell count, unspecified: Secondary | ICD-10-CM | POA: Insufficient documentation

## 2024-07-22 DIAGNOSIS — N132 Hydronephrosis with renal and ureteral calculous obstruction: Secondary | ICD-10-CM | POA: Insufficient documentation

## 2024-07-22 LAB — CBC WITH DIFFERENTIAL/PLATELET
Abs Immature Granulocytes: 0.07 10*3/uL (ref 0.00–0.07)
Basophils Absolute: 0.1 10*3/uL (ref 0.0–0.1)
Basophils Relative: 1 %
Eosinophils Absolute: 0 10*3/uL (ref 0.0–0.5)
Eosinophils Relative: 0 %
HCT: 50 % (ref 39.0–52.0)
Hemoglobin: 16.3 g/dL (ref 13.0–17.0)
Immature Granulocytes: 1 %
Lymphocytes Relative: 12 %
Lymphs Abs: 1.7 10*3/uL (ref 0.7–4.0)
MCH: 29.1 pg (ref 26.0–34.0)
MCHC: 32.6 g/dL (ref 30.0–36.0)
MCV: 89.3 fL (ref 80.0–100.0)
Monocytes Absolute: 0.8 10*3/uL (ref 0.1–1.0)
Monocytes Relative: 6 %
Neutro Abs: 11.4 10*3/uL — ABNORMAL HIGH (ref 1.7–7.7)
Neutrophils Relative %: 80 %
Platelets: 247 10*3/uL (ref 150–400)
RBC: 5.6 MIL/uL (ref 4.22–5.81)
RDW: 13 % (ref 11.5–15.5)
WBC: 14 10*3/uL — ABNORMAL HIGH (ref 4.0–10.5)
nRBC: 0 % (ref 0.0–0.2)

## 2024-07-22 LAB — COMPREHENSIVE METABOLIC PANEL WITH GFR
ALT: 42 U/L (ref 0–44)
AST: 37 U/L (ref 15–41)
Albumin: 4.8 g/dL (ref 3.5–5.0)
Alkaline Phosphatase: 90 U/L (ref 38–126)
Anion gap: 15 (ref 5–15)
BUN: 13 mg/dL (ref 6–20)
CO2: 21 mmol/L — ABNORMAL LOW (ref 22–32)
Calcium: 10.2 mg/dL (ref 8.9–10.3)
Chloride: 104 mmol/L (ref 98–111)
Creatinine, Ser: 1.12 mg/dL (ref 0.61–1.24)
GFR, Estimated: >60 mL/min
Glucose, Bld: 149 mg/dL — ABNORMAL HIGH (ref 70–99)
Potassium: 3.8 mmol/L (ref 3.5–5.1)
Sodium: 141 mmol/L (ref 135–145)
Total Bilirubin: 0.4 mg/dL (ref 0.0–1.2)
Total Protein: 8 g/dL (ref 6.5–8.1)

## 2024-07-22 LAB — LIPASE, BLOOD: Lipase: 33 U/L (ref 11–51)

## 2024-07-22 MED ORDER — SODIUM CHLORIDE 0.9 % IV BOLUS
1000.0000 mL | Freq: Once | INTRAVENOUS | Status: AC
  Administered 2024-07-22: 1000 mL via INTRAVENOUS

## 2024-07-22 MED ORDER — KETOROLAC TROMETHAMINE 15 MG/ML IJ SOLN
15.0000 mg | Freq: Once | INTRAMUSCULAR | Status: AC
  Administered 2024-07-22: 15 mg via INTRAVENOUS
  Filled 2024-07-22: qty 1

## 2024-07-22 MED ORDER — HYDROMORPHONE HCL 1 MG/ML IJ SOLN
1.0000 mg | Freq: Once | INTRAMUSCULAR | Status: AC
  Administered 2024-07-22: 1 mg via INTRAVENOUS
  Filled 2024-07-22: qty 1

## 2024-07-22 MED ORDER — KETOROLAC TROMETHAMINE 30 MG/ML IJ SOLN: 30.0000 mg | Freq: Once | INTRAMUSCULAR | Status: DC

## 2024-07-22 MED ORDER — IOHEXOL 350 MG/ML SOLN
100.0000 mL | Freq: Once | INTRAVENOUS | Status: AC | PRN
  Administered 2024-07-22: 100 mL via INTRAVENOUS

## 2024-07-22 MED ORDER — ONDANSETRON HCL 4 MG/2ML IJ SOLN
4.0000 mg | Freq: Once | INTRAMUSCULAR | Status: AC
  Administered 2024-07-22: 4 mg via INTRAVENOUS
  Filled 2024-07-22: qty 2

## 2024-07-22 NOTE — ED Provider Notes (Signed)
 "  [facility] Provider Note    Event Date/Time   First MD Initiated Contact with Patient 07/22/24 2248     (approximate)   History   Chief Complaint: Abdominal Pain   HPI  John Randall is a 44 y.o. male with a history of perforated diverticulitis who comes ED complaining of severe right lower quadrant abdominal pain, nonradiating.  Associated with nausea and vomiting and chills.  No dysuria or hematuria        Past Medical History:  Diagnosis Date   Acid reflux    Diverticulitis 2024   L4-5 disc bulge 06/27/2015   Sleep apnea       Past Surgical History:  Procedure Laterality Date   EYE SURGERY      Physical Exam   Triage Vital Signs: ED Triage Vitals  Encounter Vitals Group     BP 07/22/24 2216 112/84     Girls Systolic BP Percentile --      Girls Diastolic BP Percentile --      Boys Systolic BP Percentile --      Boys Diastolic BP Percentile --      Pulse Rate 07/22/24 2216 62     Resp 07/22/24 2216 (!) 22     Temp 07/22/24 2216 97.8 F (36.6 C)     Temp Source 07/22/24 2216 Oral     SpO2 07/22/24 2216 100 %     Weight 07/22/24 2215 250 lb (113.4 kg)     Height 07/22/24 2215 6' (1.829 m)     Head Circumference --      Peak Flow --      Pain Score 07/22/24 2215 10     Pain Loc --      Pain Education --      Exclude from Growth Chart --     Most recent vital signs: Vitals:   07/22/24 2216  BP: 112/84  Pulse: 62  Resp: (!) 22  Temp: 97.8 F (36.6 C)  SpO2: 100%    General: Awake, no distress.  CV:  Good peripheral perfusion.  Regular rate and rhythm, normal distal pulses Resp:  Normal effort.  Abd:  No distention.  Soft, mild right lower quadrant tenderness, no guarding Other:  Moist oral mucosa   ED Results / Procedures / Treatments   Labs (all labs ordered are listed, but only abnormal results are displayed) Labs Reviewed  COMPREHENSIVE METABOLIC PANEL WITH GFR - Abnormal; Notable for the following  components:      Result Value   CO2 21 (*)    Glucose, Bld 149 (*)    All other components within normal limits  CBC WITH DIFFERENTIAL/PLATELET - Abnormal; Notable for the following components:   WBC 14.0 (*)    Neutro Abs 11.4 (*)    All other components within normal limits  LIPASE, BLOOD  URINALYSIS, W/ REFLEX TO CULTURE (INFECTION SUSPECTED)     EKG    RADIOLOGY CT abdomen pelvis pending   PROCEDURES:  Procedures   MEDICATIONS ORDERED IN ED: Medications  sodium chloride  0.9 % bolus 1,000 mL (1,000 mLs Intravenous New Bag/Given 07/22/24 2235)  HYDROmorphone  (DILAUDID ) injection 1 mg (1 mg Intravenous Given 07/22/24 2237)  ondansetron  (ZOFRAN ) injection 4 mg (4 mg Intravenous Given 07/22/24 2237)  ketorolac  (TORADOL ) 15 MG/ML injection 15 mg (15 mg Intravenous Given 07/22/24 2301)  iohexol  (OMNIPAQUE ) 350 MG/ML injection 100 mL (100 mLs Intravenous Contrast Given 07/22/24 2318)     IMPRESSION / MDM /  ASSESSMENT AND PLAN / ED COURSE  I reviewed the triage vital signs and the nursing notes.  DDx: Appendicitis, diverticulitis, ureterolithiasis, mesenteric adenitis, UTI  Patient's presentation is most consistent with acute presentation with potential threat to life or bodily function.  Patient presents with severe recurrent right lower quadrant abdominal pain, reminding him of his past experience with diverticulitis.  Vital signs unremarkable, pain is improving with Dilaudid .  Will obtain labs and CT.       FINAL CLINICAL IMPRESSION(S) / ED DIAGNOSES   Final diagnoses:  RLQ abdominal pain     Rx / DC Orders   ED Discharge Orders     None        Note:  This document was prepared using Dragon voice recognition software and may include unintentional dictation errors.   Viviann Pastor, MD 07/22/24 2324  "

## 2024-07-22 NOTE — ED Provider Notes (Signed)
 11:05 PM  Assumed care at shift change.  Patient has history of perforated cecal diverticulitis without abscess.  Managed with antibiotics.  Coming in with right lower quadrant pain that feels similar.  Labs, urine, CT abdomen pelvis pending.  12:27 AM  Pt's labs show leukocytosis of 14,000.  Normal creatinine, LFTs and lipase.  Urine pending.  CT of the abdomen pelvis reviewed and interpreted by myself and the radiologist and shows an obstructing 5 x 8 x 11 mm right UPJ stone with mild right hydronephrosis.  Normal appendix.  No diverticulitis.  Pain has been controlled after Dilaudid .  States he is feeling much better, tolerating p.o.  Discussed with patient and wife that given the size of the stone that there is a chance that he will not pass this on his own and will need close urologic follow-up but if his pain is controlled here and he has no signs or symptoms of sepsis, continues to be hemodynamically stable that he can be discharged for close outpatient management and follow-up.  He is comfortable with this plan.  Will provide with prescription for pain medication, nausea medication, Flomax .  Will discharge with a urine strainer.  1:45 AM  Pt continues to be hemodynamically stable.  Urine shows no sign of infection.  Patient states his pain is still well-controlled and he is tolerating p.o.  Given the size of his stone we did discuss admission for pain control and urologic consultation in the morning but patient states he feels comfortable with this plan for discharge home with outpatient oral pain medications and to follow-up with urology.   At this time, I do not feel there is any life-threatening condition present. I reviewed all nursing notes, vitals, pertinent previous records.  All lab and urine results, EKGs, imaging ordered have been independently reviewed and interpreted by myself.  I reviewed all available radiology reports from any imaging ordered this visit.  Based on my assessment,  I feel the patient is safe to be discharged home without further emergent workup and can continue workup as an outpatient as needed. Discussed all findings, treatment plan as well as usual and customary return precautions.  They verbalize understanding and are comfortable with this plan.  Outpatient follow-up has been provided as needed.  All questions have been answered.    Tandy Lewin, Josette SAILOR, DO 07/23/24 [phone]

## 2024-07-22 NOTE — ED Triage Notes (Signed)
 Pt to ED from home c/o right abd pain that started suddenly at 1800 tonight.  States n/v x5-6 times that has been dark and coffee ground like.  Similar pain in the past from perforated diverticulitis.  Pt pale, very uncomfortable in triage.

## 2024-07-23 LAB — URINALYSIS, W/ REFLEX TO CULTURE (INFECTION SUSPECTED)
Bacteria, UA: NONE SEEN
Bilirubin Urine: NEGATIVE
Glucose, UA: NEGATIVE mg/dL
Hgb urine dipstick: NEGATIVE
Ketones, ur: 5 mg/dL — AB
Leukocytes,Ua: NEGATIVE
Nitrite: NEGATIVE
Protein, ur: NEGATIVE mg/dL
RBC / HPF: 0 RBC/hpf (ref 0–5)
Specific Gravity, Urine: >1.046 — ABNORMAL HIGH (ref 1.005–1.030)
Squamous Epithelial / HPF: 0 /HPF (ref 0–5)
WBC, UA: 0 WBC/hpf (ref 0–5)
pH: 6 (ref 5.0–8.0)

## 2024-07-23 MED ORDER — SODIUM CHLORIDE 0.9 % IV BOLUS (SEPSIS)
1000.0000 mL | Freq: Once | INTRAVENOUS | Status: AC
  Administered 2024-07-23: 1000 mL via INTRAVENOUS

## 2024-07-23 MED ORDER — TAMSULOSIN HCL 0.4 MG PO CAPS: 0.4000 mg | ORAL_CAPSULE | Freq: Every day | ORAL | 0 refills | Status: AC

## 2024-07-23 MED ORDER — OXYCODONE HCL 5 MG PO TABS: 5.0000 mg | ORAL_TABLET | Freq: Three times a day (TID) | ORAL | 0 refills | Status: AC | PRN

## 2024-07-23 MED ORDER — IBUPROFEN 800 MG PO TABS: 800.0000 mg | ORAL_TABLET | Freq: Three times a day (TID) | ORAL | 0 refills | Status: AC | PRN

## 2024-07-23 MED ORDER — ONDANSETRON 4 MG PO TBDP: 4.0000 mg | ORAL_TABLET | Freq: Four times a day (QID) | ORAL | 0 refills | Status: AC | PRN

## 2024-07-23 NOTE — Discharge Instructions (Addendum)

## 2024-07-26 ENCOUNTER — Other Ambulatory Visit: Payer: Self-pay | Admitting: Urology

## 2024-07-27 ENCOUNTER — Encounter (HOSPITAL_COMMUNITY): Payer: Self-pay | Admitting: Urology

## 2024-07-27 NOTE — Progress Notes (Signed)
 [facility]O PREOP PHONE CALL   ALLERGIES REVIEWED: YES  MEDICATION REVIEW DONE: YES MEDICATIONS THAT PT SHOULD HOLD (LIST): NSAIDS 48hr (calling pt day before will hold today and in am)  CAN PT WALK UP STAIRS WITHOUT SHORTNESS OF BREATH: YES HOME O2: NO CPAP: NO- says supposed to but doesn't have equip  IF YES, INFORMED PT TO BRING CPAP WITH TUBING AND MASK:YES/NO   INFORMED DRIVER NEEDED FOR PROCEDURE: YES   PT WAS GIVEN BLUE FOLDER AT UROLOGY APPT: YES PT INFORMED TO BRING BLUE FOLDER WITH ALL CONTENTS: YES  REVIEWED ARRIVAL TIME AND LOCATION: YES  OTHER PERTINENT INFORMATION:

## 2024-07-28 ENCOUNTER — Ambulatory Visit (HOSPITAL_COMMUNITY)

## 2024-07-28 ENCOUNTER — Other Ambulatory Visit: Payer: Self-pay

## 2024-07-28 ENCOUNTER — Encounter (HOSPITAL_COMMUNITY)
Admission: RE | Disposition: A | Discharge status: Home / Self Care | Payer: Self-pay | Source: Home / Self Care | Attending: Urology

## 2024-07-28 ENCOUNTER — Ambulatory Visit (HOSPITAL_COMMUNITY)
Admission: RE | Admit: 2024-07-28 | Discharge: 2024-07-28 | Disposition: A | Discharge status: Home / Self Care | Payer: Self-pay | Attending: Urology | Admitting: Urology

## 2024-07-28 ENCOUNTER — Encounter (HOSPITAL_COMMUNITY): Payer: Self-pay | Admitting: Urology

## 2024-07-28 DIAGNOSIS — N201 Calculus of ureter: Secondary | ICD-10-CM | POA: Insufficient documentation

## 2024-07-28 MED ORDER — OXYCODONE HCL 5 MG PO TABS
ORAL_TABLET | ORAL | Status: AC
  Administered 2024-07-28: 5 mg
  Filled 2024-07-28: qty 1

## 2024-07-28 MED ORDER — CIPROFLOXACIN HCL 500 MG PO TABS
500.0000 mg | ORAL_TABLET | Freq: Once | ORAL | Status: AC
  Administered 2024-07-28: 500 mg via ORAL
  Filled 2024-07-28: qty 1

## 2024-07-28 MED ORDER — DIAZEPAM 5 MG PO TABS
10.0000 mg | ORAL_TABLET | Freq: Once | ORAL | Status: AC
  Administered 2024-07-28: 10 mg via ORAL
  Filled 2024-07-28: qty 2

## 2024-07-28 MED ORDER — DIPHENHYDRAMINE HCL 25 MG PO CAPS
25.0000 mg | ORAL_CAPSULE | Freq: Once | ORAL | Status: AC
  Administered 2024-07-28: 25 mg via ORAL
  Filled 2024-07-28: qty 1

## 2024-07-28 MED ORDER — SODIUM CHLORIDE 0.9 % IV SOLN: INTRAVENOUS | Status: DC

## 2024-07-28 NOTE — Discharge Instructions (Signed)
 I have reviewed discharge instructions in detail with the patient. They will follow-up with me or their physician as scheduled. My nurse will also be calling the patients as per protocol.

## 2024-07-28 NOTE — Interval H&P Note (Signed)
 History and Physical Interval Note:  07/28/2024 7:26 AM  John Randall  has presented today for surgery, with the diagnosis of RIGHT URETERAL STONE.  The various methods of treatment have been discussed with the patient and family. After consideration of risks, benefits and other options for treatment, the patient has consented to  Procedures with comments: LITHOTRIPSY, ESWL (Right) - RIGHT EXTRACOCRPOREAL SHOCKWAVE LITHOTRIPSY as a surgical intervention.  The patient's history has been reviewed, patient examined, no change in status, stable for surgery.  I have reviewed the patient's chart and labs.  Questions were answered to the patient's satisfaction.     Hussain Maimone A Mateen Franssen

## 2024-07-31 ENCOUNTER — Encounter (HOSPITAL_COMMUNITY): Payer: Self-pay | Admitting: Urology

## 2024-08-02 ENCOUNTER — Telehealth: Payer: Self-pay | Admitting: Physician Assistant

## 2024-08-02 NOTE — Telephone Encounter (Signed)
 Lvm & sent message to reschedule 07/20/2024 missed appointment-Toni
